# Patient Record
Sex: Female | Born: 2000 | Race: Black or African American | Hispanic: No | Marital: Single | Smoking: Never smoker
Health system: Southern US, Community
[De-identification: ages and names within clinical notes are randomized; demographics above are authoritative.]

## PROBLEM LIST (undated history)

## (undated) DIAGNOSIS — Z789 Other specified health status: Secondary | ICD-10-CM

## (undated) DIAGNOSIS — J353 Hypertrophy of tonsils with hypertrophy of adenoids: Secondary | ICD-10-CM

## (undated) HISTORY — PX: NO PAST SURGERIES: SHX2092

---
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
## 2020-11-29 ENCOUNTER — Emergency Department (HOSPITAL_BASED_OUTPATIENT_CLINIC_OR_DEPARTMENT_OTHER): Payer: BC Managed Care – PPO

## 2020-11-29 ENCOUNTER — Emergency Department (HOSPITAL_BASED_OUTPATIENT_CLINIC_OR_DEPARTMENT_OTHER)
Admission: EM | Admit: 2020-11-29 | Discharge: 2020-11-29 | Disposition: A | Discharge status: Home / Self Care | Payer: BC Managed Care – PPO | Attending: Emergency Medicine | Admitting: Emergency Medicine

## 2020-11-29 ENCOUNTER — Encounter (HOSPITAL_BASED_OUTPATIENT_CLINIC_OR_DEPARTMENT_OTHER): Payer: Self-pay

## 2020-11-29 ENCOUNTER — Other Ambulatory Visit: Payer: Self-pay

## 2020-11-29 DIAGNOSIS — R350 Frequency of micturition: Secondary | ICD-10-CM | POA: Diagnosis not present

## 2020-11-29 DIAGNOSIS — R102 Pelvic and perineal pain: Secondary | ICD-10-CM | POA: Diagnosis not present

## 2020-11-29 DIAGNOSIS — R1031 Right lower quadrant pain: Secondary | ICD-10-CM | POA: Diagnosis present

## 2020-11-29 LAB — COMPREHENSIVE METABOLIC PANEL
ALT: <5 U/L (ref 0–44)
AST: 10 U/L — ABNORMAL LOW (ref 15–41)
Albumin: 4.4 g/dL (ref 3.5–5.0)
Alkaline Phosphatase: 45 U/L (ref 38–126)
Anion gap: 9 (ref 5–15)
BUN: 8 mg/dL (ref 6–20)
CO2: 26 mmol/L (ref 22–32)
Calcium: 9.9 mg/dL (ref 8.9–10.3)
Chloride: 102 mmol/L (ref 98–111)
Creatinine, Ser: 0.83 mg/dL (ref 0.44–1.00)
GFR, Estimated: >60 mL/min (ref >60)
Glucose, Bld: 90 mg/dL (ref 70–99)
Potassium: 3.6 mmol/L (ref 3.5–5.1)
Sodium: 137 mmol/L (ref 135–145)
Total Bilirubin: 0.7 mg/dL (ref 0.3–1.2)
Total Protein: 8 g/dL (ref 6.5–8.1)

## 2020-11-29 LAB — URINALYSIS, ROUTINE W REFLEX MICROSCOPIC
Bilirubin Urine: NEGATIVE
Glucose, UA: NEGATIVE mg/dL
Ketones, ur: 15 mg/dL — AB
Nitrite: NEGATIVE
Protein, ur: 30 mg/dL — AB
Specific Gravity, Urine: 1.032 — ABNORMAL HIGH (ref 1.005–1.030)
pH: 6 (ref 5.0–8.0)

## 2020-11-29 LAB — LIPASE, BLOOD: Lipase: 11 U/L (ref 11–51)

## 2020-11-29 LAB — PREGNANCY, URINE: Preg Test, Ur: NEGATIVE

## 2020-11-29 LAB — CBC
HCT: 43 % (ref 36.0–46.0)
Hemoglobin: 13.5 g/dL (ref 12.0–15.0)
MCH: 26.7 pg (ref 26.0–34.0)
MCHC: 31.4 g/dL (ref 30.0–36.0)
MCV: 85.1 fL (ref 80.0–100.0)
Platelets: 257 10*3/uL (ref 150–400)
RBC: 5.05 MIL/uL (ref 3.87–5.11)
RDW: 13.2 % (ref 11.5–15.5)
WBC: 12.2 10*3/uL — ABNORMAL HIGH (ref 4.0–10.5)
nRBC: 0 % (ref 0.0–0.2)

## 2020-11-29 LAB — WET PREP, GENITAL
Sperm: NONE SEEN
Trich, Wet Prep: NONE SEEN
WBC, Wet Prep HPF POC: >=10 — AB (ref <10)
Yeast Wet Prep HPF POC: NONE SEEN

## 2020-11-29 LAB — HIV ANTIBODY (ROUTINE TESTING W REFLEX): HIV Screen 4th Generation wRfx: NONREACTIVE

## 2020-11-29 MED ORDER — METRONIDAZOLE 500 MG PO TABS: 500.0000 mg | ORAL_TABLET | Freq: Two times a day (BID) | ORAL | 0 refills | Status: AC

## 2020-11-29 MED ORDER — IOHEXOL 300 MG/ML  SOLN
80.0000 mL | Freq: Once | INTRAMUSCULAR | Status: AC | PRN
  Administered 2020-11-29: 100 mL via INTRAVENOUS

## 2020-11-29 MED ORDER — DOXYCYCLINE HYCLATE 100 MG PO CAPS: 100.0000 mg | ORAL_CAPSULE | Freq: Two times a day (BID) | ORAL | 0 refills | Status: AC

## 2020-11-29 NOTE — Discharge Instructions (Addendum)
The pelvic ultrasound was normal and the CT scan of your abdomen did not show appendicitis or other acute findings.  You were given prescriptions for antibiotics to treat suspect pelvic inflammatory disease. Please take the antibiotic prescriptions fully.   Do not drink alcohol while in flagyl is it will make you sick with nausea and vomiting   You have been tested for HIV, syphilis, chlamydia and gonorrhea.  These results will be available in approximately 3 days and you will be contacted by the hospital if the results are positive. Avoid sexual contact until you are aware of the results, and please inform all sexual partners if you test positive for any of these diseases.  Please follow up with the OB-GYN provided on your discharge paperwork in the next 5-7 days.   Please return to the ER sooner if you have any new or worsening symptoms, or if you have any of the following symptoms:  Abdominal pain that does not go away.  You have a fever.  You keep throwing up (vomiting).  The pain is felt only in portions of the abdomen. Pain in the right side could possibly be appendicitis. In an adult, pain in the left lower portion of the abdomen could be colitis or diverticulitis.  You pass bloody or black tarry stools.  There is bright red blood in the stool.  The constipation stays for more than 4 days.  There is belly (abdominal) or rectal pain.  You do not seem to be getting better.  You have any questions or concerns.

## 2020-11-29 NOTE — ED Notes (Signed)
Patient transported to ultrasound.

## 2020-11-29 NOTE — ED Provider Notes (Addendum)
[facility] EMERGENCY DEPT Provider Note   CSN: [id number] Arrival date & time: 11/29/20  1052     History Chief Complaint  Patient presents with   Abdominal Pain    Helen Dalton is a 20 y.o. female.  HPI   20 y/o female who presents to the ED today for eval of abd pain. Reports pain is located to the lower abdomen. Pain is constant in nature and feels sharp. Rates pain 5-6/10. Denies exacerbating or alleviating factors. Denies NVD. She has had constipation. Last BM was 3 days ago. Denies dysuria, hematuria. She reports that she has had some light pink/brownish/yellow discharge. She further states that her last menstrual cycle was abnormal. Reports some urinary frequency and subjective fevers for the last week.   History reviewed. No pertinent past medical history.  There are no problems to display for this patient.   History reviewed. No pertinent surgical history.   OB History   No obstetric history on file.     No family history on file.  Social History   Tobacco Use   Smoking status: Never   Smokeless tobacco: Never  Vaping Use   Vaping Use: Every day  Substance Use Topics   Alcohol use: Yes    Comment: occ   Drug use: Never    Home Medications Prior to Admission medications   Medication Sig Start Date End Date Taking? Authorizing Provider  doxycycline (VIBRAMYCIN) 100 MG capsule Take 1 capsule (100 mg total) by mouth 2 (two) times daily for 14 days. 11/29/20 12/13/20 Yes Melvin Marmo S, PA-C  metroNIDAZOLE (FLAGYL) 500 MG tablet Take 1 tablet (500 mg total) by mouth 2 (two) times daily for 14 days. 11/29/20 12/13/20 Yes Maryam Feely S, PA-C  cyclobenzaprine (FLEXERIL) 5 MG tablet 1 tablet every 8 hours as he did for muscle spasms 11/05/17   Paulette Blanch, MD  HYDROcodone-acetaminophen (NORCO) 5-325 MG tablet Take 1 tablet by mouth every 6 (six) hours as needed for moderate pain. 11/05/17   Paulette Blanch, MD  ibuprofen (ADVIL,MOTRIN) 600  MG tablet Take 1 tablet (600 mg total) by mouth every 8 (eight) hours as needed. 11/05/17   Paulette Blanch, MD    Allergies    Patient has no known allergies.  Review of Systems   Review of Systems  Constitutional:  Positive for fever.  HENT:  Negative for sore throat.   Eyes:  Negative for visual disturbance.  Respiratory:  Negative for shortness of breath.   Cardiovascular:  Negative for chest pain.  Gastrointestinal:  Positive for constipation. Negative for abdominal pain, diarrhea, nausea and vomiting.  Genitourinary:  Positive for pelvic pain and vaginal discharge. Negative for decreased urine volume, dysuria, flank pain, frequency, hematuria and urgency.  Musculoskeletal:  Negative for back pain.  Skin:  Negative for rash.  Neurological:  Negative for seizures and syncope.  All other systems reviewed and are negative.  Physical Exam Updated Vital Signs BP (!) 101/51 (BP Location: Left Arm)   Pulse 85   Temp 98 F (36.7 C) (Oral)   Resp 16   Ht 5\' 6"  (1.676 m)   Wt 77.1 kg   LMP 11/10/2020 (Approximate)   SpO2 100%   BMI 27.44 kg/m   Physical Exam Vitals and nursing note reviewed.  Constitutional:      General: She is not in acute distress.    Appearance: She is well-developed.  HENT:     Head: Normocephalic and atraumatic.  Eyes:  Conjunctiva/sclera: Conjunctivae normal.  Cardiovascular:     Rate and Rhythm: Normal rate and regular rhythm.     Heart sounds: No murmur heard. Pulmonary:     Effort: Pulmonary effort is normal. No respiratory distress.     Breath sounds: Normal breath sounds.  Abdominal:     General: Bowel sounds are normal.     Palpations: Abdomen is soft.     Tenderness: There is abdominal tenderness in the right lower quadrant. There is no guarding or rebound.  Genitourinary:    Comments: Exam performed by Karrie Meres,  exam chaperoned Date: 11/29/2020 Pelvic exam: normal external genitalia without evidence of trauma. VULVA: normal  appearing vulva with no masses, tenderness or lesion. VAGINA: normal appearing vagina with normal color and white discharge, no lesions. CERVIX: normal appearing cervix without lesions, cervical motion tenderness present and mild, cervical os closed with out purulent discharge; vaginal discharge - white and copious, Wet prep and DNA probe for chlamydia and GC obtained.   ADNEXA: normal adnexa in size, and no masses, mild right adnexal ttp UTERUS: uterus is normal size, shape, consistency and nontender.   Musculoskeletal:        General: No swelling.     Cervical back: Neck supple.  Skin:    General: Skin is warm and dry.     Capillary Refill: Capillary refill takes less than 2 seconds.  Neurological:     Mental Status: She is alert.  Psychiatric:        Mood and Affect: Mood normal.    ED Results / Procedures / Treatments   Labs (all labs ordered are listed, but only abnormal results are displayed) Labs Reviewed  WET PREP, GENITAL - Abnormal; Notable for the following components:      Result Value   Clue Cells Wet Prep HPF POC PRESENT (*)    WBC, Wet Prep HPF POC >=10 (*)    All other components within normal limits  COMPREHENSIVE METABOLIC PANEL - Abnormal; Notable for the following components:   AST 10 (*)    All other components within normal limits  CBC - Abnormal; Notable for the following components:   WBC 12.2 (*)    All other components within normal limits  URINALYSIS, ROUTINE W REFLEX MICROSCOPIC - Abnormal; Notable for the following components:   Specific Gravity, Urine 1.032 (*)    Hgb urine dipstick SMALL (*)    Ketones, ur 15 (*)    Protein, ur 30 (*)    Leukocytes,Ua MODERATE (*)    Bacteria, UA RARE (*)    All other components within normal limits  LIPASE, BLOOD  PREGNANCY, URINE  HIV ANTIBODY (ROUTINE TESTING W REFLEX)  RPR  GC/CHLAMYDIA PROBE AMP ([place]) NOT AT [facility]    EKG None  Radiology CT ABDOMEN PELVIS W CONTRAST  Result Date:  11/29/2020 CLINICAL DATA:  Right lower quadrant abdominal pain following menstrual period two weeks ago EXAM: CT ABDOMEN AND PELVIS WITH CONTRAST TECHNIQUE: Multidetector CT imaging of the abdomen and pelvis was performed using the standard protocol following bolus administration of intravenous contrast. CONTRAST:  OMNIPAQUE IOHEXOL 300 MG/ML  SOLN COMPARISON:  None. FINDINGS: Lower chest: No acute abnormality. Hepatobiliary: No solid liver abnormality is seen. No gallstones, gallbladder wall thickening, or biliary dilatation. Pancreas: Unremarkable. No pancreatic ductal dilatation or surrounding inflammatory changes. Spleen: Normal in size without significant abnormality. Adrenals/Urinary Tract: Adrenal glands are unremarkable. Kidneys are normal, without renal calculi, solid lesion, or hydronephrosis. Bladder is unremarkable.  Stomach/Bowel: Stomach is within normal limits. Appendix is not clearly visualized. No evidence of bowel wall thickening, distention, or inflammatory changes. Vascular/Lymphatic: No significant vascular findings are present. No enlarged abdominal or pelvic lymph nodes. Reproductive: No mass or other significant abnormality. Bilateral functional ovarian follicles. Other: No abdominal wall hernia or abnormality. No abdominopelvic ascites. Musculoskeletal: No acute or significant osseous findings. IMPRESSION: 1. No acute CT findings of the abdomen or pelvis to explain right lower quadrant pain. 2. Appendix is not clearly visualized, however there are no secondary inflammatory findings in the right lower quadrant to suggest appendicitis. Electronically Signed   By: Delanna Ahmadi M.D.   On: 11/29/2020 17:56   US PELVIC COMPLETE WITH TRANSVAGINAL  Result Date: 11/29/2020 CLINICAL DATA:  Pelvic pain for 1 week with spotting EXAM: TRANSABDOMINAL AND TRANSVAGINAL ULTRASOUND OF PELVIS TECHNIQUE: Both transabdominal and transvaginal ultrasound examinations of the pelvis were performed.  Transabdominal technique was performed for global imaging of the pelvis including uterus, ovaries, adnexal regions, and pelvic cul-de-sac. It was necessary to proceed with endovaginal exam following the transabdominal exam to visualize the endometrium. COMPARISON:  None FINDINGS: Uterus Measurements: 7.2 x 3.6 x 4.1 cm = volume: 56 mL. Anteverted. No fibroids or other mass visualized. Endometrium Thickness: 10 mm.  No focal abnormality visualized. Right ovary Measurements: 3.3 x 2.5 x 2.7 cm = volume: 12 mL. Multiple small follicles. Normal appearance/no adnexal mass. Left ovary Measurements: 3.7 x 2.6 x 2.6 cm = volume: 13 mL. Multiple small follicles. Normal appearance/no adnexal mass. Other findings No abnormal free fluid. IMPRESSION: Unremarkable pelvic ultrasound. No findings to explain patient's pain. Electronically Signed   By: Davina Poke D.O.   On: 11/29/2020 16:07    Procedures Procedures   Medications Ordered in ED Medications  iohexol (OMNIPAQUE) 300 MG/ML solution 80 mL (100 mLs Intravenous Contrast Given 11/29/20 1628)    ED Course  I have reviewed the triage vital signs and the nursing notes.  Pertinent labs & imaging results that were available during my care of the patient were reviewed by me and considered in my medical decision making (see chart for details).    MDM Rules/Calculators/A&P                          20 y/o f here with pelvic pain   Reviewed/interpreted labs CBC - mild leukocytosis, no anemia CMP - wnl Lipase - wnl UA - appears contaminated, doubt UTI Wet prep consistent with BV GC/chlam, HIV, RPR pending   Reviewed/interpreted imaging Pelvic US - neg for TOOA or other emergent abnormality CT abd/pelvis w/o emergent findings  Pt likely with PID. Will tx with ceftriaxone, flagyl and doxy. Advised obgyn f/u and strict return precautions. She voices understanding and is in agreement with plan. All questions answered, pt stable or discharge.   Final  Clinical Impression(s) / ED Diagnoses Final diagnoses:  Pelvic pain    Rx / DC Orders ED Discharge Orders          Ordered    doxycycline (VIBRAMYCIN) 100 MG capsule  2 times daily        11/29/20 1806    metroNIDAZOLE (FLAGYL) 500 MG tablet  2 times daily        11/29/20 1807             Niko Jakel S, PA-C 11/29/20 1807    Rodney Booze, PA-C 11/29/20 2141    Gareth Morgan, MD 11/30/20 0004

## 2020-11-29 NOTE — ED Triage Notes (Signed)
Pt states she has been having lower abdominal pain since her period stopped about 2 weeks ago. Pt states her period was WNL, maybe worsened cramping. Denies any NVD. Some vaginal bleeding after her period ended. Denies urinary symptoms. Denies concern for STD or STI.

## 2020-11-30 LAB — GC/CHLAMYDIA PROBE AMP (~~LOC~~) NOT AT ARMC
Chlamydia: NEGATIVE
Comment: NEGATIVE
Comment: NORMAL
Neisseria Gonorrhea: POSITIVE — AB

## 2020-11-30 LAB — RPR: RPR Ser Ql: NONREACTIVE

## 2020-12-02 ENCOUNTER — Other Ambulatory Visit: Payer: Self-pay

## 2020-12-02 ENCOUNTER — Emergency Department (HOSPITAL_BASED_OUTPATIENT_CLINIC_OR_DEPARTMENT_OTHER)
Admission: EM | Admit: 2020-12-02 | Discharge: 2020-12-02 | Disposition: A | Discharge status: Home / Self Care | Payer: BC Managed Care – PPO | Attending: Emergency Medicine | Admitting: Emergency Medicine

## 2020-12-02 ENCOUNTER — Encounter (HOSPITAL_BASED_OUTPATIENT_CLINIC_OR_DEPARTMENT_OTHER): Payer: Self-pay

## 2020-12-02 DIAGNOSIS — Z202 Contact with and (suspected) exposure to infections with a predominantly sexual mode of transmission: Secondary | ICD-10-CM | POA: Diagnosis present

## 2020-12-02 DIAGNOSIS — A549 Gonococcal infection, unspecified: Secondary | ICD-10-CM | POA: Diagnosis not present

## 2020-12-02 MED ORDER — LIDOCAINE HCL (PF) 1 % IJ SOLN
INTRAMUSCULAR | Status: AC
  Administered 2020-12-02: 1 mL
  Filled 2020-12-02: qty 5

## 2020-12-02 MED ORDER — CEFTRIAXONE SODIUM 500 MG IJ SOLR
500.0000 mg | Freq: Once | INTRAMUSCULAR | Status: AC
  Administered 2020-12-02: 500 mg via INTRAMUSCULAR
  Filled 2020-12-02: qty 500

## 2020-12-02 NOTE — ED Provider Notes (Signed)
[facility] EMERGENCY DEPT Provider Note   CSN: [id number] Arrival date & time: 12/02/20  0849     History Chief Complaint  Patient presents with   Exposure to STD    Helen Dalton is a 20 y.o. female.  HPI Patient had STD testing in the emergency department 3 days ago.  Gonorrhea is returned positive.  Patient is here for treatment.  She has been taking doxycycline and Flagyl as prescribed after being treated for suspected PID.  She reports she is feeling better.  She denies any significant ongoing pain.  No fever no nausea no vomiting.  No joint swelling, no skin rashes, no sore throat.    History reviewed. No pertinent past medical history.  There are no problems to display for this patient.   History reviewed. No pertinent surgical history.   OB History   No obstetric history on file.     No family history on file.  Social History   Tobacco Use   Smoking status: Never   Smokeless tobacco: Never  Vaping Use   Vaping Use: Every day  Substance Use Topics   Alcohol use: Yes    Comment: occ   Drug use: Never    Home Medications Prior to Admission medications   Medication Sig Start Date End Date Taking? Authorizing Provider  doxycycline (VIBRAMYCIN) 100 MG capsule Take 1 capsule (100 mg total) by mouth 2 (two) times daily for 14 days. 11/29/20 12/13/20 Yes Couture, Cortni S, PA-C  metroNIDAZOLE (FLAGYL) 500 MG tablet Take 1 tablet (500 mg total) by mouth 2 (two) times daily for 14 days. 11/29/20 12/13/20 Yes Couture, Cortni S, PA-C  cyclobenzaprine (FLEXERIL) 5 MG tablet 1 tablet every 8 hours as he did for muscle spasms Patient not taking: Reported on 12/02/2020 11/05/17   Irean Hong, MD  HYDROcodone-acetaminophen (NORCO) 5-325 MG tablet Take 1 tablet by mouth every 6 (six) hours as needed for moderate pain. Patient not taking: Reported on 12/02/2020 11/05/17   Irean Hong, MD  ibuprofen (ADVIL,MOTRIN) 600 MG tablet Take 1 tablet (600 mg  total) by mouth every 8 (eight) hours as needed. Patient not taking: Reported on 12/02/2020 11/05/17   Irean Hong, MD    Allergies    Patient has no known allergies.  Review of Systems   Review of Systems 10 system reviewed negative except per HPI Physical Exam Updated Vital Signs BP 104/72 (BP Location: Right Arm)   Pulse 79   Temp 98.2 F (36.8 C) (Oral)   Resp 14   Ht 5\' 6"  (1.676 m)   Wt 76.2 kg   LMP 11/10/2020 (Approximate)   SpO2 100%   BMI 27.12 kg/m   Physical Exam Constitutional:      Appearance: Normal appearance.  HENT:     Mouth/Throat:     Mouth: Mucous membranes are moist.     Pharynx: Oropharynx is clear.  Eyes:     Extraocular Movements: Extraocular movements intact.     Conjunctiva/sclera: Conjunctivae normal.  Cardiovascular:     Rate and Rhythm: Normal rate and regular rhythm.  Pulmonary:     Effort: Pulmonary effort is normal.     Breath sounds: Normal breath sounds.  Abdominal:     General: There is no distension.     Palpations: Abdomen is soft.     Tenderness: There is no abdominal tenderness. There is no guarding.  Musculoskeletal:        General: No swelling or tenderness. Normal range of  motion.     Right lower leg: No edema.  Skin:    General: Skin is warm and dry.  Neurological:     General: No focal deficit present.     Mental Status: She is alert and oriented to person, place, and time.     Coordination: Coordination normal.  Psychiatric:        Mood and Affect: Mood normal.    ED Results / Procedures / Treatments   Labs (all labs ordered are listed, but only abnormal results are displayed) Labs Reviewed - No data to display  EKG None  Radiology No results found.  Procedures Procedures   Medications Ordered in ED Medications  cefTRIAXone (ROCEPHIN) injection 500 mg (has no administration in time range)    ED Course  I have reviewed the triage vital signs and the nursing notes.  Pertinent labs & imaging  results that were available during my care of the patient were reviewed by me and considered in my medical decision making (see chart for details).    MDM Rules/Calculators/A&P                           Patient alert and nontoxic.  Clinically well.  Review of systems negative for any systemic signs of gonorrhea.  Patient will be treated with Rocephin IM.  She is already taking doxycycline and Flagyl prescribed 3 days earlier.  Also to continue these medications.  No pain control needed at this time. Final Clinical Impression(s) / ED Diagnoses Final diagnoses:  Gonorrhea    Rx / DC Orders ED Discharge Orders     None        Arby Barrette, MD 12/04/20 [phone]

## 2020-12-02 NOTE — ED Triage Notes (Signed)
Pt states she was seen 11/20 for STD screening.  States she received a phone call yesterday from ED with instructions to return to ED due to one testing positive for one of STD screenings.  Pt currently taking Doxycycline 100 mg 2x/day and Metronidazole 500 mg 2x/day.

## 2020-12-02 NOTE — Discharge Instructions (Signed)
1.  Follow-up with your gynecologist for recheck in 1 to 2 weeks. 2.  Gonorrhea is treated with a one-time injection of antibiotic.  Continue your doxycycline and Flagyl that were [place] 3 days earlier.  Finish these prescriptions.  Take ibuprofen if needed for pain. 3 return to the emergency department if you get suddenly increasing pain, fever, vomiting or other concerning symptoms.

## 2021-01-12 ENCOUNTER — Ambulatory Visit
Admission: EM | Admit: 2021-01-12 | Discharge: 2021-01-12 | Disposition: A | Discharge status: Home / Self Care | Payer: BC Managed Care – PPO | Attending: Physician Assistant | Admitting: Physician Assistant

## 2021-01-12 ENCOUNTER — Other Ambulatory Visit: Payer: Self-pay

## 2021-01-12 ENCOUNTER — Encounter: Payer: Self-pay | Admitting: Emergency Medicine

## 2021-01-12 DIAGNOSIS — N898 Other specified noninflammatory disorders of vagina: Secondary | ICD-10-CM | POA: Diagnosis present

## 2021-01-12 MED ORDER — FLUCONAZOLE 150 MG PO TABS: 150.0000 mg | ORAL_TABLET | Freq: Every day | ORAL | 0 refills | Status: DC

## 2021-01-12 NOTE — ED Provider Notes (Signed)
[facility]    CSN: [id number] Arrival date & time: 01/12/21  [id number]      History   Chief Complaint Chief Complaint  Patient presents with   Vaginitis    HPI Helen Dalton is a 21 y.o. female.   Patient here today for evaluation of vaginal discharge. She reports that she thinks she may have a yeast infection because discharge is thick and white. She has had some itching vaginally as well. She denies vaginal odor. She would like STD screening but denies any known exposure. She has not had any other symptoms.  The history is provided by the patient.   History reviewed. No pertinent past medical history.  There are no problems to display for this patient.   History reviewed. No pertinent surgical history.  OB History   No obstetric history on file.      Home Medications    Prior to Admission medications   Medication Sig Start Date End Date Taking? Authorizing Provider  fluconazole (DIFLUCAN) 150 MG tablet Take 1 tablet (150 mg total) by mouth daily. 01/12/21  Yes Francene Finders, PA-C  cyclobenzaprine (FLEXERIL) 5 MG tablet 1 tablet every 8 hours as he did for muscle spasms Patient not taking: Reported on 12/02/2020 11/05/17   Paulette Blanch, MD  HYDROcodone-acetaminophen (NORCO) 5-325 MG tablet Take 1 tablet by mouth every 6 (six) hours as needed for moderate pain. Patient not taking: Reported on 12/02/2020 11/05/17   Paulette Blanch, MD  ibuprofen (ADVIL,MOTRIN) 600 MG tablet Take 1 tablet (600 mg total) by mouth every 8 (eight) hours as needed. Patient not taking: Reported on 12/02/2020 11/05/17   Paulette Blanch, MD    Family History History reviewed. No pertinent family history.  Social History Social History   Tobacco Use   Smoking status: Never   Smokeless tobacco: Never  Vaping Use   Vaping Use: Every day  Substance Use Topics   Alcohol use: Yes    Comment: occ   Drug use: Never     Allergies   Patient has no known allergies.   Review of  Systems Review of Systems  Constitutional:  Negative for chills and fever.  Eyes:  Negative for discharge and redness.  Respiratory:  Negative for shortness of breath.   Gastrointestinal:  Negative for abdominal pain, nausea and vomiting.  Genitourinary:  Positive for vaginal discharge. Negative for dysuria.  Musculoskeletal:  Negative for back pain.    Physical Exam Triage Vital Signs ED Triage Vitals  Enc Vitals Group     BP      Pulse      Resp      Temp      Temp src      SpO2      Weight      Height      Head Circumference      Peak Flow      Pain Score      Pain Loc      Pain Edu?      Excl. in [place]?    No data found.  Updated Vital Signs BP 112/74 (BP Location: Left Arm)    Pulse 80    Temp 99 F (37.2 C) (Oral)    Resp 18    Ht 5\' 6"  (1.676 m)    Wt 167 lb (75.8 kg)    LMP 12/10/2020    SpO2 98%    BMI 26.95 kg/m     Physical Exam Vitals  and nursing note reviewed.  Constitutional:      General: She is not in acute distress.    Appearance: Normal appearance. She is not ill-appearing.  HENT:     Head: Normocephalic and atraumatic.  Eyes:     Conjunctiva/sclera: Conjunctivae normal.  Cardiovascular:     Rate and Rhythm: Normal rate.  Pulmonary:     Effort: Pulmonary effort is normal.  Neurological:     Mental Status: She is alert.  Psychiatric:        Mood and Affect: Mood normal.        Behavior: Behavior normal.        Thought Content: Thought content normal.     UC Treatments / Results  Labs (all labs ordered are listed, but only abnormal results are displayed) Labs Reviewed  CERVICOVAGINAL ANCILLARY ONLY    EKG   Radiology No results found.  Procedures Procedures (including critical care time)  Medications Ordered in UC Medications - No data to display  Initial Impression / Assessment and Plan / UC Course  I have reviewed the triage vital signs and the nursing notes.  Pertinent labs & imaging results that were available during my  care of the patient were reviewed by me and considered in my medical decision making (see chart for details).   Diflucan prescribed. STD screening ordered. Recommend follow up with any further concerns.  Final Clinical Impressions(s) / UC Diagnoses   Final diagnoses:  Vaginal discharge   Discharge Instructions   None    ED Prescriptions     Medication Sig Dispense Auth. Provider   fluconazole (DIFLUCAN) 150 MG tablet Take 1 tablet (150 mg total) by mouth daily. 1 tablet Francene Finders, PA-C      PDMP not reviewed this encounter.   Francene Finders, PA-C 01/12/21 [phone]

## 2021-01-12 NOTE — ED Triage Notes (Signed)
Patient c/o possible yeast infection x 1 week.  Patient is having thick white vaginal discharge and itchy, no odor.  Requesting STI check as well.

## 2021-01-13 LAB — CERVICOVAGINAL ANCILLARY ONLY
Bacterial Vaginitis (gardnerella): NEGATIVE
Candida Glabrata: NEGATIVE
Candida Vaginitis: POSITIVE — AB
Chlamydia: NEGATIVE
Comment: NEGATIVE
Comment: NEGATIVE
Comment: NEGATIVE
Comment: NEGATIVE
Comment: NEGATIVE
Comment: NORMAL
Neisseria Gonorrhea: NEGATIVE
Trichomonas: NEGATIVE

## 2021-06-04 ENCOUNTER — Ambulatory Visit
Admission: EM | Admit: 2021-06-04 | Discharge: 2021-06-04 | Disposition: A | Discharge status: Home / Self Care | Payer: BC Managed Care – PPO | Attending: Physician Assistant | Admitting: Physician Assistant

## 2021-06-04 ENCOUNTER — Encounter: Payer: Self-pay | Admitting: Emergency Medicine

## 2021-06-04 DIAGNOSIS — N898 Other specified noninflammatory disorders of vagina: Secondary | ICD-10-CM | POA: Insufficient documentation

## 2021-06-04 DIAGNOSIS — Z113 Encounter for screening for infections with a predominantly sexual mode of transmission: Secondary | ICD-10-CM | POA: Diagnosis not present

## 2021-06-04 LAB — POCT URINE PREGNANCY: Preg Test, Ur: NEGATIVE

## 2021-06-04 MED ORDER — DOXYCYCLINE HYCLATE 100 MG PO CAPS: 100.0000 mg | ORAL_CAPSULE | Freq: Two times a day (BID) | ORAL | 0 refills | Status: DC

## 2021-06-04 MED ORDER — CEFTRIAXONE SODIUM 250 MG IJ SOLR
500.0000 mg | Freq: Once | INTRAMUSCULAR | Status: AC
  Administered 2021-06-04: 500 mg via INTRAMUSCULAR

## 2021-06-04 NOTE — ED Triage Notes (Signed)
Patient c/o vaginal discharge x 1 day.   Patient denies odor, ABD pain, or vaginal itching.   Patient denies any known STI exposures.   Patient endorses " brown" discharge.   Patient request STI testing.

## 2021-06-04 NOTE — Discharge Instructions (Signed)
Return if any problems.

## 2021-06-06 NOTE — ED Provider Notes (Signed)
[facility]    CSN: [id number] Arrival date & time: 06/04/21  0840      History   Chief Complaint Chief Complaint  Patient presents with   Vaginal Discharge    HPI Helen Dalton is a 21 y.o. female.   Patient complains of a brown vaginal discharge she request testing for sexually transmitted disease patient is asking if she can receive treatment today  The history is provided by the patient. No language interpreter was used.  Vaginal Discharge Quality:  Manson Passey Timing:  Constant Chronicity:  New Relieved by:  Nothing Worsened by:  Nothing Ineffective treatments:  None tried Risk factors: STI    History reviewed. No pertinent past medical history.  There are no problems to display for this patient.   History reviewed. No pertinent surgical history.  OB History   No obstetric history on file.      Home Medications    Prior to Admission medications   Medication Sig Start Date End Date Taking? Authorizing Provider  clindamycin (CLEOCIN T) 1 % lotion Apply topically every morning. 04/07/21  Yes [provider]  doxycycline (VIBRAMYCIN) 100 MG capsule Take 1 capsule (100 mg total) by mouth 2 (two) times daily. 06/04/21  Yes Cheron Schaumann K, PA-C  tretinoin (RETIN-A) 0.025 % cream Apply topically at bedtime. 04/07/21  Yes [provider]  cyclobenzaprine (FLEXERIL) 5 MG tablet 1 tablet every 8 hours as he did for muscle spasms Patient not taking: Reported on 12/02/2020 11/05/17   Irean Hong, MD  fluconazole (DIFLUCAN) 150 MG tablet Take 1 tablet (150 mg total) by mouth daily. 01/12/21   Tomi Bamberger, PA-C  HYDROcodone-acetaminophen (NORCO) 5-325 MG tablet Take 1 tablet by mouth every 6 (six) hours as needed for moderate pain. Patient not taking: Reported on 12/02/2020 11/05/17   Irean Hong, MD  ibuprofen (ADVIL,MOTRIN) 600 MG tablet Take 1 tablet (600 mg total) by mouth every 8 (eight) hours as needed. Patient not taking: Reported  on 12/02/2020 11/05/17   Irean Hong, MD    Family History History reviewed. No pertinent family history.  Social History Social History   Tobacco Use   Smoking status: Never   Smokeless tobacco: Never  Vaping Use   Vaping Use: Every day  Substance Use Topics   Alcohol use: Yes    Comment: occ   Drug use: Never     Allergies   Patient has no known allergies.   Review of Systems Review of Systems  Genitourinary:  Positive for vaginal discharge.  All other systems reviewed and are negative.   Physical Exam Triage Vital Signs ED Triage Vitals  Enc Vitals Group     BP 06/04/21 0956 108/73     Pulse Rate 06/04/21 0956 85     Resp 06/04/21 0956 16     Temp 06/04/21 0956 97.8 F (36.6 C)     Temp Source 06/04/21 0956 Oral     SpO2 06/04/21 0956 98 %     Weight --      Height --      Head Circumference --      Peak Flow --      Pain Score 06/04/21 0959 0     Pain Loc --      Pain Edu? --      Excl. in [place]? --    No data found.  Updated Vital Signs BP 108/73 (BP Location: Right Arm)   Pulse 85   Temp 97.8  F (36.6 C) (Oral)   Resp 16   LMP 05/10/2021 (Approximate)   SpO2 98%   Visual Acuity Right Eye Distance:   Left Eye Distance:   Bilateral Distance:    Right Eye Near:   Left Eye Near:    Bilateral Near:     Physical Exam Vitals and nursing note reviewed.  Constitutional:      Appearance: She is well-developed.  HENT:     Head: Normocephalic.     Mouth/Throat:     Mouth: Mucous membranes are moist.  Cardiovascular:     Rate and Rhythm: Normal rate.  Pulmonary:     Effort: Pulmonary effort is normal.  Abdominal:     General: There is no distension.  Musculoskeletal:        General: Normal range of motion.     Cervical back: Normal range of motion.  Skin:    General: Skin is warm.  Neurological:     Mental Status: She is alert and oriented to person, place, and time.  Psychiatric:        Mood and Affect: Mood normal.     UC  Treatments / Results  Labs (all labs ordered are listed, but only abnormal results are displayed) Labs Reviewed  POCT URINE PREGNANCY  CERVICOVAGINAL ANCILLARY ONLY    EKG   Radiology No results found.  Procedures Procedures (including critical care time)  Medications Ordered in UC Medications  cefTRIAXone (ROCEPHIN) injection 500 mg (500 mg Intramuscular Given 06/04/21 1045)    Initial Impression / Assessment and Plan / UC Course  I have reviewed the triage vital signs and the nursing notes.  Pertinent labs & imaging results that were available during my care of the patient were reviewed by me and considered in my medical decision making (see chart for details).     MDM: Patient given Rocephin injection and prescription for doxycycline STD testing pending Final Clinical Impressions(s) / UC Diagnoses   Final diagnoses:  Vaginal discharge     Discharge Instructions      Return if any problems.    ED Prescriptions     Medication Sig Dispense Auth. Provider   doxycycline (VIBRAMYCIN) 100 MG capsule Take 1 capsule (100 mg total) by mouth 2 (two) times daily. 20 capsule Elson Areas, [place]      PDMP not reviewed this encounter. An After Visit Summary was printed and given to the patient.    Elson Areas, [place] 06/06/21 [phone]

## 2021-06-09 LAB — CERVICOVAGINAL ANCILLARY ONLY
Bacterial Vaginitis (gardnerella): NEGATIVE
Candida Glabrata: NEGATIVE
Candida Vaginitis: POSITIVE — AB
Chlamydia: POSITIVE — AB
Comment: NEGATIVE
Comment: NEGATIVE
Comment: NEGATIVE
Comment: NEGATIVE
Comment: NEGATIVE
Comment: NORMAL
Neisseria Gonorrhea: NEGATIVE
Trichomonas: NEGATIVE

## 2021-06-10 ENCOUNTER — Telehealth (HOSPITAL_COMMUNITY): Payer: Self-pay | Admitting: Emergency Medicine

## 2021-06-10 MED ORDER — FLUCONAZOLE 150 MG PO TABS: 150.0000 mg | ORAL_TABLET | Freq: Once | ORAL | 0 refills | Status: AC

## 2021-10-26 ENCOUNTER — Ambulatory Visit
Admission: EM | Admit: 2021-10-26 | Discharge: 2021-10-26 | Disposition: A | Discharge status: Home / Self Care | Payer: BC Managed Care – PPO | Attending: Physician Assistant | Admitting: Physician Assistant

## 2021-10-26 DIAGNOSIS — N760 Acute vaginitis: Secondary | ICD-10-CM

## 2021-10-26 DIAGNOSIS — Z113 Encounter for screening for infections with a predominantly sexual mode of transmission: Secondary | ICD-10-CM | POA: Diagnosis present

## 2021-10-26 MED ORDER — IBUPROFEN 600 MG PO TABS: 600.0000 mg | ORAL_TABLET | Freq: Three times a day (TID) | ORAL | 0 refills | Status: DC | PRN

## 2021-10-26 NOTE — ED Provider Notes (Signed)
[facility]    CSN: [id number] Arrival date & time: 10/26/21  0827      History   Chief Complaint Chief Complaint  Patient presents with   Vaginal Discharge    HPI Helen Dalton is a 21 y.o. female.   21 year old female presents with vaginal discharge and right lower quadrant pain.  Patient indicates that she desires to have STI testing to include HIV and RPR.  Patient indicates that her last sexual contact was about a week and a half ago and this was unprotected.  Patient indicates for the past week she has been having vaginal discharge, thick, white, with an odor.  Patient indicates she does not have any vaginal itching present.  Patient indicates she does not have any frequency, urgency, or dysuria.  She indicates that she is currently not taking any birth control, her last period was September 29 and was normal.  She also indicates she has had some intermittent right lower quadrant pain and discomfort which is localized, with some mild increased bowel gas.  She denies any fever, chills, nausea or vomiting.  She indicates she is not taking any OTC medicines presently to help relieve her discomfort.   Vaginal Discharge Associated symptoms: abdominal pain (right lower quadrant at ovary.)     History reviewed. No pertinent past medical history.  There are no problems to display for this patient.   History reviewed. No pertinent surgical history.  OB History   No obstetric history on file.      Home Medications    Prior to Admission medications   Medication Sig Start Date End Date Taking? Authorizing Provider  clindamycin (CLEOCIN T) 1 % lotion Apply topically every morning. 04/07/21   [provider]  cyclobenzaprine (FLEXERIL) 5 MG tablet 1 tablet every 8 hours as he did for muscle spasms Patient not taking: Reported on 12/02/2020 11/05/17   Paulette Blanch, MD  doxycycline (VIBRAMYCIN) 100 MG capsule Take 1 capsule (100 mg total) by mouth 2 (two)  times daily. 06/04/21   Fransico Meadow, PA-C  HYDROcodone-acetaminophen (NORCO) 5-325 MG tablet Take 1 tablet by mouth every 6 (six) hours as needed for moderate pain. Patient not taking: Reported on 12/02/2020 11/05/17   Paulette Blanch, MD  ibuprofen (ADVIL) 600 MG tablet Take 1 tablet (600 mg total) by mouth every 8 (eight) hours as needed. 10/26/21   Nyoka Lint, PA-C  tretinoin (RETIN-A) 0.025 % cream Apply topically at bedtime. 04/07/21   [provider]    Family History History reviewed. No pertinent family history.  Social History Social History   Tobacco Use   Smoking status: Never   Smokeless tobacco: Never  Vaping Use   Vaping Use: Every day  Substance Use Topics   Alcohol use: Yes    Comment: occ   Drug use: Never     Allergies   Patient has no known allergies.   Review of Systems Review of Systems  Gastrointestinal:  Positive for abdominal pain (right lower quadrant at ovary.).  Genitourinary:  Positive for vaginal discharge.     Physical Exam Triage Vital Signs ED Triage Vitals  Enc Vitals Group     BP 10/26/21 0943 111/76     Pulse Rate 10/26/21 0943 68     Resp 10/26/21 0943 18     Temp 10/26/21 0943 (!) 97.4 F (36.3 C)     Temp Source 10/26/21 0943 Oral     SpO2 10/26/21 0943 98 %  Weight --      Height --      Head Circumference --      Peak Flow --      Pain Score 10/26/21 0942 3     Pain Loc --      Pain Edu? --      Excl. in [place]? --    No data found.  Updated Vital Signs BP 111/76   Pulse 68   Temp (!) 97.4 F (36.3 C) (Oral)   Resp 18   LMP 10/01/2021   SpO2 98%   Visual Acuity Right Eye Distance:   Left Eye Distance:   Bilateral Distance:    Right Eye Near:   Left Eye Near:    Bilateral Near:     Physical Exam Constitutional:      Appearance: Normal appearance.  Abdominal:     General: Abdomen is flat. Bowel sounds are normal.     Palpations: Abdomen is soft.     Tenderness: There is abdominal  tenderness in the right lower quadrant. There is no guarding or rebound.     Comments: Abdomen: Pain is palpated at the right ovary area without guarding.  Neurological:     Mental Status: She is alert.      UC Treatments / Results  Labs (all labs ordered are listed, but only abnormal results are displayed) Labs Reviewed  HIV ANTIBODY (ROUTINE TESTING W REFLEX)  RAPID HIV SCREEN (HIV 1/2 AB+AG)  CERVICOVAGINAL ANCILLARY ONLY    EKG   Radiology No results found.  Procedures Procedures (including critical care time)  Medications Ordered in UC Medications - No data to display  Initial Impression / Assessment and Plan / UC Course  I have reviewed the triage vital signs and the nursing notes.  Pertinent labs & imaging results that were available during my care of the patient were reviewed by me and considered in my medical decision making (see chart for details).    Plan: 1.  The vaginitis will be treated with the following: A.  STI testing results to include screening for BV and yeast is pending. B.  The vaginitis will be treated depending on the results of the STI testing. 2.  Screening for STI will be treated with the following: A.  STI testing notes to include HIV and RPR is pending. 3.  Patient is advised to take ibuprofen 600 mg every 8 hours to treat the right lower quadrant ovarian pain when needed. 4.  Advise follow-up with PCP or return to urgent care as needed. Final Clinical Impressions(s) / UC Diagnoses   Final diagnoses:  Vaginitis and vulvovaginitis  Routine screening for STI (sexually transmitted infection)     Discharge Instructions      Advised take ibuprofen 600 mg every 8 hours if needed for pain. Lab test will be completed in 48-72 hours.  If you do not hear from this office within 48-72 hours that indicates the test is negative.  Log onto MyChart to view the test results when they post in 24 and 48 hours. Advised to follow-up with PCP or return  to urgent care if symptoms fail to improve.    ED Prescriptions     Medication Sig Dispense Auth. Provider   ibuprofen (ADVIL) 600 MG tablet  (Status: Discontinued) Take 1 tablet (600 mg total) by mouth every 8 (eight) hours as needed. 15 tablet Ellsworth Lennox, PA-C   ibuprofen (ADVIL) 600 MG tablet Take 1 tablet (600 mg total) by mouth every  8 (eight) hours as needed. 15 tablet Ellsworth Lennox, PA-C      PDMP not reviewed this encounter.   Ellsworth Lennox, PA-C 10/26/21 1004

## 2021-10-26 NOTE — ED Triage Notes (Signed)
Pt presents to uc with co of vaginal discharge for 2 days, pt reports pmh of bv concerned for potential bv again, slight right sided pelvic pain.

## 2021-10-26 NOTE — Discharge Instructions (Signed)
Advised take ibuprofen 600 mg every 8 hours if needed for pain. Lab test will be completed in 48-72 hours.  If you do not hear from this office within 48-72 hours that indicates the test is negative.  Log onto MyChart to view the test results when they post in 24 and 48 hours. Advised to follow-up with PCP or return to urgent care if symptoms fail to improve.

## 2021-10-27 LAB — CERVICOVAGINAL ANCILLARY ONLY
Bacterial Vaginitis (gardnerella): POSITIVE — AB
Candida Glabrata: NEGATIVE
Candida Vaginitis: POSITIVE — AB
Chlamydia: NEGATIVE
Comment: NEGATIVE
Comment: NEGATIVE
Comment: NEGATIVE
Comment: NEGATIVE
Comment: NEGATIVE
Comment: NORMAL
Neisseria Gonorrhea: NEGATIVE
Trichomonas: NEGATIVE

## 2021-10-27 LAB — HIV ANTIBODY (ROUTINE TESTING W REFLEX): HIV Screen 4th Generation wRfx: NONREACTIVE

## 2021-10-27 LAB — RPR: RPR Ser Ql: NONREACTIVE

## 2021-10-28 ENCOUNTER — Telehealth (HOSPITAL_COMMUNITY): Payer: Self-pay | Admitting: Emergency Medicine

## 2021-10-28 MED ORDER — METRONIDAZOLE 500 MG PO TABS: 500.0000 mg | ORAL_TABLET | Freq: Two times a day (BID) | ORAL | 0 refills | Status: DC

## 2021-10-28 MED ORDER — FLUCONAZOLE 150 MG PO TABS: 150.0000 mg | ORAL_TABLET | Freq: Once | ORAL | 0 refills | Status: AC

## 2022-06-27 ENCOUNTER — Encounter (HOSPITAL_BASED_OUTPATIENT_CLINIC_OR_DEPARTMENT_OTHER): Payer: Self-pay | Admitting: Otolaryngology

## 2022-06-27 ENCOUNTER — Other Ambulatory Visit: Payer: Self-pay

## 2022-07-01 NOTE — H&P (Signed)
HPI:  Helen Dalton is a 22 y.o. female who presents as a new Patient.  Referring Provider: No ref. provider found  Chief complaint: Tonsils.  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 Also history of loud snoring. Otherwise in good health.  PMH/Meds/All/SocHx/FamHx/ROS:  History reviewed. No pertinent past medical history.  History reviewed. No pertinent surgical history.  No family history of bleeding disorders, wound healing problems or difficulty with anesthesia.    No current outpatient medications on file.  A complete ROS was performed with pertinent positives/negatives noted in the HPI. The remainder of the ROS are negative.   Physical Exam:  Pulse 89  Temp 97.7 F (36.5 C) (Temporal)  Resp 18  Ht 1.676 m (5\' 6" )  Wt 89.5 kg (197 lb 6.4 oz)  SpO2 98%  BMI 31.86 kg/m  General: Healthy and alert, in no distress, breathing easily. Normal affect. In a pleasant mood. Head: Normocephalic, atraumatic. No masses, or scars. Eyes: Pupils are equal, and reactive to light. Vision is grossly intact. No spontaneous or gaze nystagmus. Ears: Ear canals are clear. Tympanic membranes are intact, with normal landmarks and the middle ears are clear and healthy. Hearing: Grossly normal. Nose: Nasal cavities are clear with healthy mucosa, no polyps or exudate. Airways are patent. Face: No masses or scars, facial nerve function is symmetric. Oral Cavity: No mucosal abnormalities are noted. Tongue with normal mobility. Dentition appears healthy. Oropharynx: Tonsils are symmetric, 3+ enlarged. There are no mucosal masses identified. Tongue base appears normal and healthy. Larynx/Hypopharynx: deferred Chest: Deferred Neck: No palpable masses, no cervical adenopathy, no thyroid nodules or enlargement. Neuro: Cranial nerves II-XII with normal function. Balance: Normal gate. Other findings: none.  Independent Review of Additional  Tests or Records: none  Procedures: none  Impression & Plans: Chronic tonsil enlargement with snoring and chronic and recurring tonsillopharyngitis. Consider tonsillectomy.Helen Dalton meets the indications for tonsillectomy. Risks and benefits were discussed in detail. All questions were answered. A handout was provided with additional details.

## 2022-07-04 ENCOUNTER — Ambulatory Visit (HOSPITAL_BASED_OUTPATIENT_CLINIC_OR_DEPARTMENT_OTHER)
Admission: RE | Admit: 2022-07-04 | Discharge: 2022-07-04 | Disposition: A | Discharge status: Home / Self Care | Payer: BC Managed Care – PPO | Attending: Otolaryngology | Admitting: Otolaryngology

## 2022-07-04 ENCOUNTER — Ambulatory Visit (HOSPITAL_BASED_OUTPATIENT_CLINIC_OR_DEPARTMENT_OTHER): Payer: BC Managed Care – PPO | Admitting: Anesthesiology

## 2022-07-04 ENCOUNTER — Encounter (HOSPITAL_BASED_OUTPATIENT_CLINIC_OR_DEPARTMENT_OTHER): Payer: Self-pay | Admitting: Otolaryngology

## 2022-07-04 ENCOUNTER — Encounter (HOSPITAL_BASED_OUTPATIENT_CLINIC_OR_DEPARTMENT_OTHER)
Admission: RE | Disposition: A | Discharge status: Home / Self Care | Payer: Self-pay | Source: Home / Self Care | Attending: Otolaryngology

## 2022-07-04 ENCOUNTER — Other Ambulatory Visit: Payer: Self-pay

## 2022-07-04 DIAGNOSIS — J3501 Chronic tonsillitis: Secondary | ICD-10-CM | POA: Diagnosis present

## 2022-07-04 DIAGNOSIS — R0683 Snoring: Secondary | ICD-10-CM | POA: Diagnosis not present

## 2022-07-04 DIAGNOSIS — Z01818 Encounter for other preprocedural examination: Secondary | ICD-10-CM

## 2022-07-04 HISTORY — PX: TONSILLECTOMY: SHX5217

## 2022-07-04 HISTORY — DX: Other specified health status: Z78.9

## 2022-07-04 HISTORY — DX: Hypertrophy of tonsils with hypertrophy of adenoids: J35.3

## 2022-07-04 LAB — POCT PREGNANCY, URINE
Preg Test, Ur: NEGATIVE
Preg Test, Ur: NEGATIVE

## 2022-07-04 SURGERY — TONSILLECTOMY
Anesthesia: General | Site: Mouth | Laterality: Bilateral

## 2022-07-04 MED ORDER — SUGAMMADEX SODIUM 500 MG/5ML IV SOLN
INTRAVENOUS | Status: DC | PRN
  Administered 2022-07-04: 200 mg via INTRAVENOUS

## 2022-07-04 MED ORDER — LACTATED RINGERS IV SOLN: INTRAVENOUS | Status: DC

## 2022-07-04 MED ORDER — ONDANSETRON HCL 4 MG/2ML IJ SOLN
INTRAMUSCULAR | Status: AC
  Filled 2022-07-04: qty 2

## 2022-07-04 MED ORDER — DEXAMETHASONE SODIUM PHOSPHATE 10 MG/ML IJ SOLN
INTRAMUSCULAR | Status: AC
  Filled 2022-07-04: qty 1

## 2022-07-04 MED ORDER — FENTANYL CITRATE (PF) 100 MCG/2ML IJ SOLN
25.0000 ug | INTRAMUSCULAR | Status: DC | PRN
  Administered 2022-07-04: 50 ug via INTRAVENOUS

## 2022-07-04 MED ORDER — FENTANYL CITRATE (PF) 100 MCG/2ML IJ SOLN
INTRAMUSCULAR | Status: DC | PRN
  Administered 2022-07-04: 100 ug via INTRAVENOUS
  Administered 2022-07-04: 50 ug via INTRAVENOUS

## 2022-07-04 MED ORDER — PROPOFOL 10 MG/ML IV BOLUS
INTRAVENOUS | Status: AC
  Filled 2022-07-04: qty 20

## 2022-07-04 MED ORDER — LIDOCAINE HCL (CARDIAC) PF 100 MG/5ML IV SOSY
PREFILLED_SYRINGE | INTRAVENOUS | Status: DC | PRN
  Administered 2022-07-04: 100 mg via INTRAVENOUS

## 2022-07-04 MED ORDER — ROCURONIUM BROMIDE 100 MG/10ML IV SOLN
INTRAVENOUS | Status: DC | PRN
  Administered 2022-07-04: 50 mg via INTRAVENOUS

## 2022-07-04 MED ORDER — FENTANYL CITRATE (PF) 100 MCG/2ML IJ SOLN
INTRAMUSCULAR | Status: AC
  Filled 2022-07-04: qty 2

## 2022-07-04 MED ORDER — LIDOCAINE 2% (20 MG/ML) 5 ML SYRINGE
INTRAMUSCULAR | Status: AC
  Filled 2022-07-04: qty 5

## 2022-07-04 MED ORDER — DEXAMETHASONE SODIUM PHOSPHATE 4 MG/ML IJ SOLN
INTRAMUSCULAR | Status: DC | PRN
  Administered 2022-07-04: 10 mg via INTRAVENOUS

## 2022-07-04 MED ORDER — ONDANSETRON 4 MG PO TBDP: 4.0000 mg | ORAL_TABLET | Freq: Three times a day (TID) | ORAL | 0 refills | Status: DC | PRN

## 2022-07-04 MED ORDER — MIDAZOLAM HCL 2 MG/2ML IJ SOLN
INTRAMUSCULAR | Status: AC
  Filled 2022-07-04: qty 2

## 2022-07-04 MED ORDER — OXYCODONE HCL 5 MG PO TABS
ORAL_TABLET | ORAL | Status: AC
  Filled 2022-07-04: qty 1

## 2022-07-04 MED ORDER — ACETAMINOPHEN 500 MG PO TABS
ORAL_TABLET | ORAL | Status: AC
  Filled 2022-07-04: qty 2

## 2022-07-04 MED ORDER — ACETAMINOPHEN 500 MG PO TABS
1000.0000 mg | ORAL_TABLET | Freq: Once | ORAL | Status: AC
  Administered 2022-07-04: 1000 mg via ORAL

## 2022-07-04 MED ORDER — OXYCODONE HCL 5 MG PO TABS
5.0000 mg | ORAL_TABLET | Freq: Once | ORAL | Status: AC
  Administered 2022-07-04: 5 mg via ORAL

## 2022-07-04 MED ORDER — MIDAZOLAM HCL 5 MG/5ML IJ SOLN
INTRAMUSCULAR | Status: DC | PRN
  Administered 2022-07-04: 2 mg via INTRAVENOUS

## 2022-07-04 MED ORDER — ONDANSETRON HCL 4 MG/2ML IJ SOLN
INTRAMUSCULAR | Status: DC | PRN
  Administered 2022-07-04: 4 mg via INTRAVENOUS

## 2022-07-04 MED ORDER — HYDROCODONE-ACETAMINOPHEN 7.5-325 MG/15ML PO SOLN: 15.0000 mL | Freq: Four times a day (QID) | ORAL | 0 refills | Status: DC | PRN

## 2022-07-04 MED ORDER — PROPOFOL 10 MG/ML IV BOLUS
INTRAVENOUS | Status: DC | PRN
  Administered 2022-07-04: 200 mg via INTRAVENOUS

## 2022-07-04 SURGICAL SUPPLY — 27 items
CANISTER SUCT 1200ML W/VALVE (MISCELLANEOUS) ×1 IMPLANT
CATH ROBINSON RED A/P 12FR (CATHETERS) ×1 IMPLANT
CLEANER CAUTERY TIP 5X5 PAD (MISCELLANEOUS) ×1 IMPLANT
COAGULATOR SUCT SWTCH 10FR 6 (ELECTROSURGICAL) ×1 IMPLANT
COVER BACK TABLE 60X90IN (DRAPES) ×1 IMPLANT
COVER MAYO STAND STRL (DRAPES) ×1 IMPLANT
DEFOGGER MIRROR 1QT (MISCELLANEOUS) IMPLANT
ELECT COATED BLADE 2.86 ST (ELECTRODE) ×1 IMPLANT
ELECT REM PT RETURN 9FT ADLT (ELECTROSURGICAL)
ELECT REM PT RETURN 9FT PED (ELECTROSURGICAL)
ELECTRODE REM PT RETRN 9FT PED (ELECTROSURGICAL) IMPLANT
ELECTRODE REM PT RTRN 9FT ADLT (ELECTROSURGICAL) IMPLANT
GAUZE SPONGE 4X4 12PLY STRL LF (GAUZE/BANDAGES/DRESSINGS) ×1 IMPLANT
GLOVE ECLIPSE 7.5 STRL STRAW (GLOVE) ×1 IMPLANT
GOWN STRL REUS W/ TWL LRG LVL3 (GOWN DISPOSABLE) ×2 IMPLANT
GOWN STRL REUS W/TWL LRG LVL3 (GOWN DISPOSABLE) ×2
MARKER SKIN DUAL TIP RULER LAB (MISCELLANEOUS) IMPLANT
NS IRRIG 1000ML POUR BTL (IV SOLUTION) ×1 IMPLANT
PENCIL FOOT CONTROL (ELECTRODE) ×1 IMPLANT
SHEET MEDIUM DRAPE 40X70 STRL (DRAPES) ×1 IMPLANT
SPONGE TONSIL 1 RF SGL (DISPOSABLE) IMPLANT
SPONGE TONSIL 1.25 RF SGL STRG (GAUZE/BANDAGES/DRESSINGS) IMPLANT
SYR BULB EAR ULCER 3OZ GRN STR (SYRINGE) ×1 IMPLANT
TOWEL GREEN STERILE FF (TOWEL DISPOSABLE) ×1 IMPLANT
TUBE CONNECTING 20X1/4 (TUBING) ×1 IMPLANT
TUBE SALEM SUMP 12FR 48 (TUBING) IMPLANT
TUBE SALEM SUMP 16F (TUBING) IMPLANT

## 2022-07-04 NOTE — Op Note (Signed)
07/04/2022  10:41 AM  PATIENT:  Helen Dalton  22 y.o. female  PRE-OPERATIVE DIAGNOSIS:  Tonsillar hypertrophy; Chronic tonsillitis  POST-OPERATIVE DIAGNOSIS:  Tonsillar hypertrophy; Chronic tonsillitis  PROCEDURE:  Procedure(s): TONSILLECTOMY  SURGEON:  Surgeon(s): Serena Colonel, MD  ANESTHESIA:   General  COUNTS: Correct   DICTATION: The patient was taken to the operating room and placed on the operating table in the supine position. Following induction of general endotracheal anesthesia, the table was turned and the patient was draped in a standard fashion. A Crowe-Davis mouthgag was inserted into the oral cavity and used to retract the tongue and mandible, then attached to the Mayo stand.  The tonsillectomy was then performed using electrocautery dissection, carefully dissecting the avascular plane between the capsule and constrictor muscles. Cautery was used for completion of hemostasis. The tonsils were large and filled with cryptic debris , and were discarded.  The pharynx was irrigated with saline and suctioned. An oral gastric tube was used to aspirate the contents of the stomach. The patient was then awakened from anesthesia and transferred to PACU in stable condition.   PATIENT DISPOSITION:  To PACA, stable

## 2022-07-04 NOTE — Anesthesia Postprocedure Evaluation (Signed)
Anesthesia Post Note  Patient: Helen Dalton  Procedure(s) Performed: TONSILLECTOMY (Bilateral: Mouth)     Patient location during evaluation: PACU Anesthesia Type: General Level of consciousness: awake and alert Pain management: pain level controlled Vital Signs Assessment: post-procedure vital signs reviewed and stable Respiratory status: spontaneous breathing, nonlabored ventilation, respiratory function stable and patient connected to nasal cannula oxygen Cardiovascular status: blood pressure returned to baseline and stable Postop Assessment: no apparent nausea or vomiting Anesthetic complications: no  No notable events documented.  Last Vitals:  Vitals:   07/04/22 1144 07/04/22 1233  BP: (!) 130/92 114/75  Pulse: 94 84  Resp: 16 15  Temp: 36.6 C 36.7 C  SpO2: 96% 96%    Last Pain:  Vitals:   07/04/22 1233  TempSrc:   PainSc: 3                  Helen Dalton

## 2022-07-04 NOTE — Anesthesia Procedure Notes (Signed)
Procedure Name: Intubation Date/Time: 07/04/2022 10:23 AM  Performed by: Karen Kitchens, CRNAPre-anesthesia Checklist: Patient identified, Emergency Drugs available, Suction available and Patient being monitored Patient Re-evaluated:Patient Re-evaluated prior to induction Oxygen Delivery Method: Circle system utilized Preoxygenation: Pre-oxygenation with 100% oxygen Induction Type: IV induction Ventilation: Mask ventilation without difficulty Laryngoscope Size: Mac and 4 Grade View: Grade I Tube type: Oral Number of attempts: 1 Airway Equipment and Method: Stylet and Oral airway Placement Confirmation: ETT inserted through vocal cords under direct vision, positive ETCO2, breath sounds checked- equal and bilateral and CO2 detector Secured at: 24 cm Tube secured with: Tape Dental Injury: Teeth and Oropharynx as per pre-operative assessment

## 2022-07-04 NOTE — Discharge Instructions (Signed)
  Post Anesthesia Home Care Instructions  Activity: Get plenty of rest for the remainder of the day. A responsible individual must stay with you for 24 hours following the procedure.  For the next 24 hours, DO NOT: -Drive a car -Operate machinery -Drink alcoholic beverages -Take any medication unless instructed by your physician -Make any legal decisions or sign important papers.  Meals: Start with liquid foods such as gelatin or soup. Progress to regular foods as tolerated. Avoid greasy, spicy, heavy foods. If nausea and/or vomiting occur, drink only clear liquids until the nausea and/or vomiting subsides. Call your physician if vomiting continues.  Special Instructions/Symptoms: Your throat may feel dry or sore from the anesthesia or the breathing tube placed in your throat during surgery. If this causes discomfort, gargle with warm salt water. The discomfort should disappear within 24 hours.  If you had a scopolamine patch placed behind your ear for the management of post- operative nausea and/or vomiting:  1. The medication in the patch is effective for 72 hours, after which it should be removed.  Wrap patch in a tissue and discard in the trash. Wash hands thoroughly with soap and water. 2. You may remove the patch earlier than 72 hours if you experience unpleasant side effects which may include dry mouth, dizziness or visual disturbances. 3. Avoid touching the patch. Wash your hands with soap and water after contact with the patch.  No tylenol until after 3pm today if needed.    

## 2022-07-04 NOTE — Transfer of Care (Signed)
Immediate Anesthesia Transfer of Care Note  Patient: Helen Dalton  Procedure(s) Performed: TONSILLECTOMY (Bilateral: Mouth)  Patient Location: PACU  Anesthesia Type:General  Level of Consciousness: awake and alert   Airway & Oxygen Therapy: Patient Spontanous Breathing and Patient connected to face mask oxygen  Post-op Assessment: Report given to RN and Post -op Vital signs reviewed and stable  Post vital signs: Reviewed and stable  Last Vitals:  Vitals Value Taken Time  BP 134/81 07/04/22 1047  Temp    Pulse 105 07/04/22 1048  Resp 16 07/04/22 1048  SpO2 88 % 07/04/22 1048  Vitals shown include unvalidated device data.  Last Pain:  Vitals:   07/04/22 0902  TempSrc: Temporal  PainSc: 0-No pain      Patients Stated Pain Goal: 4 (07/04/22 0902)  Complications: No notable events documented.

## 2022-07-04 NOTE — Anesthesia Preprocedure Evaluation (Addendum)
Anesthesia Evaluation  Patient identified by MRN, date of birth, ID band Patient awake    Reviewed: Allergy & Precautions, NPO status , Patient's Chart, lab work & pertinent test results  Airway Mallampati: I  TM Distance: >3 FB Neck ROM: Full    Dental no notable dental hx. (+) Teeth Intact, Dental Advisory Given   Pulmonary neg pulmonary ROS   Pulmonary exam normal breath sounds clear to auscultation       Cardiovascular negative cardio ROS Normal cardiovascular exam Rhythm:Regular Rate:Normal     Neuro/Psych negative neurological ROS  negative psych ROS   GI/Hepatic negative GI ROS, Neg liver ROS,,,  Endo/Other  negative endocrine ROS    Renal/GU negative Renal ROS  negative genitourinary   Musculoskeletal negative musculoskeletal ROS (+)    Abdominal   Peds  Hematology negative hematology ROS (+)   Anesthesia Other Findings   Reproductive/Obstetrics                             Anesthesia Physical Anesthesia Plan  ASA: 1  Anesthesia Plan: General   Post-op Pain Management: Tylenol PO (pre-op)*   Induction: Intravenous  PONV Risk Score and Plan: 3 and Midazolam, Dexamethasone and Ondansetron  Airway Management Planned: Oral ETT  Additional Equipment:   Intra-op Plan:   Post-operative Plan: Extubation in OR  Informed Consent: I have reviewed the patients History and Physical, chart, labs and discussed the procedure including the risks, benefits and alternatives for the proposed anesthesia with the patient or authorized representative who has indicated his/her understanding and acceptance.     Dental advisory given  Plan Discussed with: CRNA  Anesthesia Plan Comments:        Anesthesia Quick Evaluation  

## 2022-07-04 NOTE — Interval H&P Note (Signed)
History and Physical Interval Note:  07/04/2022 9:48 AM  Ralene Ok  has presented today for surgery, with the diagnosis of Tonsillar hypertrophy; Chronic tonsillitis.  The various methods of treatment have been discussed with the patient and family. After consideration of risks, benefits and other options for treatment, the patient has consented to  Procedure(s): TONSILLECTOMY (Bilateral) as a surgical intervention.  The patient's history has been reviewed, patient examined, no change in status, stable for surgery.  I have reviewed the patient's chart and labs.  Questions were answered to the patient's satisfaction.     Helen Dalton

## 2022-07-05 ENCOUNTER — Encounter (HOSPITAL_BASED_OUTPATIENT_CLINIC_OR_DEPARTMENT_OTHER): Payer: Self-pay | Admitting: Otolaryngology

## 2022-09-08 ENCOUNTER — Ambulatory Visit
Admission: RE | Admit: 2022-09-08 | Discharge: 2022-09-08 | Disposition: A | Discharge status: Home / Self Care | Payer: BC Managed Care – PPO | Source: Ambulatory Visit | Attending: Internal Medicine | Admitting: Internal Medicine

## 2022-09-08 VITALS — BP 121/83 | HR 91 | Temp 98.1°F | Resp 18

## 2022-09-08 DIAGNOSIS — R1011 Right upper quadrant pain: Secondary | ICD-10-CM | POA: Diagnosis not present

## 2022-09-08 DIAGNOSIS — Z3202 Encounter for pregnancy test, result negative: Secondary | ICD-10-CM | POA: Insufficient documentation

## 2022-09-08 DIAGNOSIS — R1030 Lower abdominal pain, unspecified: Secondary | ICD-10-CM | POA: Diagnosis present

## 2022-09-08 DIAGNOSIS — Z113 Encounter for screening for infections with a predominantly sexual mode of transmission: Secondary | ICD-10-CM | POA: Diagnosis not present

## 2022-09-08 LAB — POCT URINALYSIS DIP (MANUAL ENTRY)
Bilirubin, UA: NEGATIVE
Blood, UA: NEGATIVE
Glucose, UA: NEGATIVE mg/dL
Leukocytes, UA: NEGATIVE
Nitrite, UA: NEGATIVE
Protein Ur, POC: 100 mg/dL — AB
Spec Grav, UA: 1.02 (ref 1.010–1.025)
Urobilinogen, UA: 0.2 E.U./dL
pH, UA: 8 (ref 5.0–8.0)

## 2022-09-08 LAB — POCT URINE PREGNANCY: Preg Test, Ur: NEGATIVE

## 2022-09-08 NOTE — Discharge Instructions (Signed)
Urine test was clear.  Vaginal swab and blood work are pending.  Will call if they are abnormal.  Please go straight to the emergency department if symptoms persist or worsen.

## 2022-09-08 NOTE — ED Triage Notes (Addendum)
Pt reports abdominal pain and pressure x 3 days. Pt thinks she may have a UTI. Denies any urinary symptoms.

## 2022-09-08 NOTE — ED Provider Notes (Signed)
[facility]    CSN: [id number] Arrival date & time: 09/08/22  1742      History   Chief Complaint Chief Complaint  Patient presents with   Abdominal Pain    Entered by patient    HPI Helen Dalton is a 22 y.o. female.   Presents today with abdominal pain that started about 3 days ago.  Patient states that it started in her lower abdomen and is now seemed to move up to right upper abdomen.  She also reports that she still has some persistent lower abdominal pain as well.  States that it feels like a bladder pressure at times.  Reports that she had some dysuria when symptoms for started which is now resolved.  Denies urinary frequency, hematuria, vaginal discharge.  Patient has had unprotected sexual intercourse but denies exposure to STD.  Last menstrual cycle was around 08/24/2022.  She does not use any form of birth control.  Denies nausea, vomiting, diarrhea.  Patient having normal bowel movements with no blood in stool.  Denies fever.   Abdominal Pain   Past Medical History:  Diagnosis Date   Enlarged tonsils and adenoids    Medical history non-contributory     There are no problems to display for this patient.   Past Surgical History:  Procedure Laterality Date   NO PAST SURGERIES     TONSILLECTOMY Bilateral 07/04/2022   Procedure: TONSILLECTOMY;  Surgeon: Serena Colonel, MD;  Location: [facility];  Service: ENT;  Laterality: Bilateral;    OB History   No obstetric history on file.      Home Medications    Prior to Admission medications   Medication Sig Start Date End Date Taking? Authorizing Provider  clindamycin (CLEOCIN T) 1 % lotion Apply topically every morning. 04/07/21   [provider]  HYDROcodone-acetaminophen (HYCET) 7.5-325 mg/15 ml solution Take 15 mLs by mouth every 6 (six) hours as needed for moderate pain. 07/04/22   Serena Colonel, MD  ibuprofen (ADVIL) 600 MG tablet Take 1 tablet (600 mg total) by mouth every  8 (eight) hours as needed. 10/26/21   Ellsworth Lennox, PA-C  ondansetron (ZOFRAN-ODT) 4 MG disintegrating tablet Take 1 tablet (4 mg total) by mouth every 8 (eight) hours as needed for nausea or vomiting. 07/04/22   Serena Colonel, MD  tretinoin (RETIN-A) 0.025 % cream Apply topically at bedtime. 04/07/21   [provider]    Family History History reviewed. No pertinent family history.  Social History Social History   Tobacco Use   Smoking status: Never   Smokeless tobacco: Never  Vaping Use   Vaping status: Every Day  Substance Use Topics   Alcohol use: Yes    Comment: occ   Drug use: Never     Allergies   Patient has no known allergies.   Review of Systems Review of Systems Per HPI  Physical Exam Triage Vital Signs ED Triage Vitals [09/08/22 1750]  Encounter Vitals Group     BP 121/83     Systolic BP Percentile      Diastolic BP Percentile      Pulse Rate 91     Resp 18     Temp 98.1 F (36.7 C)     Temp Source Oral     SpO2 98 %     Weight      Height      Head Circumference      Peak Flow      Pain  Score      Pain Loc      Pain Education      Exclude from Growth Chart    No data found.  Updated Vital Signs BP 121/83 (BP Location: Left Arm)   Pulse 91   Temp 98.1 F (36.7 C) (Oral)   Resp 18   LMP 08/25/2022 (Approximate)   SpO2 98%   Visual Acuity Right Eye Distance:   Left Eye Distance:   Bilateral Distance:    Right Eye Near:   Left Eye Near:    Bilateral Near:     Physical Exam Constitutional:      General: She is not in acute distress.    Appearance: Normal appearance. She is not toxic-appearing or diaphoretic.  HENT:     Head: Normocephalic and atraumatic.  Eyes:     Extraocular Movements: Extraocular movements intact.     Conjunctiva/sclera: Conjunctivae normal.  Cardiovascular:     Rate and Rhythm: Normal rate and regular rhythm.     Pulses: Normal pulses.     Heart sounds: Normal heart sounds.  Pulmonary:      Effort: Pulmonary effort is normal. No respiratory distress.     Breath sounds: Normal breath sounds. No stridor. No wheezing, rhonchi or rales.  Abdominal:     General: Bowel sounds are normal. There is no distension.     Palpations: Abdomen is soft.     Tenderness: There is abdominal tenderness.     Comments: Patient has mild tenderness to palpation to lower abdomen.  No obvious tenderness to palpation to right upper quadrant.  Genitourinary:    Comments: Deferred with shared decision making. Self swab performed.  Neurological:     General: No focal deficit present.     Mental Status: She is alert and oriented to person, place, and time. Mental status is at baseline.  Psychiatric:        Mood and Affect: Mood normal.        Behavior: Behavior normal.        Thought Content: Thought content normal.        Judgment: Judgment normal.      UC Treatments / Results  Labs (all labs ordered are listed, but only abnormal results are displayed) Labs Reviewed  POCT URINALYSIS DIP (MANUAL ENTRY) - Abnormal; Notable for the following components:      Result Value   Clarity, UA hazy (*)    Ketones, POC UA trace (5) (*)    Protein Ur, POC =100 (*)    All other components within normal limits  COMPREHENSIVE METABOLIC PANEL  CBC  POCT URINE PREGNANCY  CERVICOVAGINAL ANCILLARY ONLY    EKG   Radiology No results found.  Procedures Procedures (including critical care time)  Medications Ordered in UC Medications - No data to display  Initial Impression / Assessment and Plan / UC Course  I have reviewed the triage vital signs and the nursing notes.  Pertinent labs & imaging results that were available during my care of the patient were reviewed by me and considered in my medical decision making (see chart for details).     There are no obvious signs of acute abdomen on exam that would need emergent evaluation or imaging at this time.  UA unremarkable.  Will send cervicovaginal  swab to ensure that abdominal pain is not due to any form of vaginitis or pelvic inflammatory disease.  Pregnancy test was negative.  CMP and CBC pending especially given patient is having  right upper quadrant abdominal pain.  Differential diagnoses include UTI versus vaginitis versus pelvic inflammatory disease.  Will await results prior to any additional treatment.  Patient was given strict ER precautions.  Patient verbalized understanding and was agreeable with plan. Final Clinical Impressions(s) / UC Diagnoses   Final diagnoses:  Lower abdominal pain  Right upper quadrant abdominal pain  Screening examination for venereal disease  Urine pregnancy test negative     Discharge Instructions      Urine test was clear.  Vaginal swab and blood work are pending.  Will call if they are abnormal.  Please go straight to the emergency department if symptoms persist or worsen.    ED Prescriptions   None    PDMP not reviewed this encounter.   Gustavus Bryant, [place] 09/08/22 [phone]

## 2022-09-09 ENCOUNTER — Telehealth: Payer: Self-pay

## 2022-09-09 LAB — COMPREHENSIVE METABOLIC PANEL
ALT: 10 IU/L (ref 0–32)
AST: 16 IU/L (ref 0–40)
Albumin: 4.4 g/dL (ref 4.0–5.0)
Alkaline Phosphatase: 79 IU/L (ref 44–121)
BUN/Creatinine Ratio: 12 (ref 9–23)
BUN: 9 mg/dL (ref 6–20)
Bilirubin Total: <0.2 mg/dL (ref 0.0–1.2)
CO2: 21 mmol/L (ref 20–29)
Calcium: 9.5 mg/dL (ref 8.7–10.2)
Chloride: 101 mmol/L (ref 96–106)
Creatinine, Ser: 0.78 mg/dL (ref 0.57–1.00)
Globulin, Total: 2.9 g/dL (ref 1.5–4.5)
Glucose: 61 mg/dL — ABNORMAL LOW (ref 70–99)
Potassium: 4.2 mmol/L (ref 3.5–5.2)
Sodium: 140 mmol/L (ref 134–144)
Total Protein: 7.3 g/dL (ref 6.0–8.5)
eGFR: 109 mL/min/{1.73_m2} (ref >59)

## 2022-09-09 LAB — CBC
Hematocrit: 36.1 % (ref 34.0–46.6)
Hemoglobin: 11.7 g/dL (ref 11.1–15.9)
MCH: 26.7 pg (ref 26.6–33.0)
MCHC: 32.4 g/dL (ref 31.5–35.7)
MCV: 82 fL (ref 79–97)
Platelets: 269 10*3/uL (ref 150–450)
RBC: 4.38 x10E6/uL (ref 3.77–5.28)
RDW: 13 % (ref 11.7–15.4)
WBC: 11 10*3/uL — ABNORMAL HIGH (ref 3.4–10.8)

## 2022-09-09 MED ORDER — FLUCONAZOLE 150 MG PO TABS: 150.0000 mg | ORAL_TABLET | Freq: Once | ORAL | 0 refills | Status: AC

## 2022-09-09 NOTE — Telephone Encounter (Signed)
Per protocol, pt requires tx with Diflucan. Attempted to reach patient x1. LVM. Rx sent to pharmacy on file.

## 2022-09-15 LAB — CERVICOVAGINAL ANCILLARY ONLY
Bacterial Vaginitis (gardnerella): NEGATIVE
Candida Glabrata: NEGATIVE
Candida Vaginitis: POSITIVE — AB
Chlamydia: NEGATIVE
Comment: NEGATIVE
Comment: NEGATIVE
Comment: NEGATIVE
Comment: NEGATIVE
Comment: NEGATIVE
Comment: NORMAL
Neisseria Gonorrhea: NEGATIVE
Trichomonas: NEGATIVE

## 2023-01-16 ENCOUNTER — Other Ambulatory Visit: Payer: Self-pay

## 2023-01-16 ENCOUNTER — Ambulatory Visit
Admission: RE | Admit: 2023-01-16 | Discharge: 2023-01-16 | Disposition: A | Discharge status: Home / Self Care | Payer: BC Managed Care – PPO | Source: Ambulatory Visit | Attending: Family Medicine | Admitting: Family Medicine

## 2023-01-16 VITALS — BP 124/86 | HR 92 | Temp 99.0°F | Resp 18

## 2023-01-16 DIAGNOSIS — N3000 Acute cystitis without hematuria: Secondary | ICD-10-CM | POA: Insufficient documentation

## 2023-01-16 LAB — POCT URINALYSIS DIP (MANUAL ENTRY)
Bilirubin, UA: NEGATIVE
Glucose, UA: NEGATIVE mg/dL
Ketones, POC UA: NEGATIVE mg/dL
Nitrite, UA: NEGATIVE
Protein Ur, POC: NEGATIVE mg/dL
Spec Grav, UA: 1.02 (ref 1.010–1.025)
Urobilinogen, UA: 0.2 U/dL
pH, UA: 7 (ref 5.0–8.0)

## 2023-01-16 LAB — POCT URINE PREGNANCY: Preg Test, Ur: NEGATIVE

## 2023-01-16 MED ORDER — CEPHALEXIN 500 MG PO CAPS: 500.0000 mg | ORAL_CAPSULE | Freq: Two times a day (BID) | ORAL | 0 refills | Status: AC

## 2023-01-16 MED ORDER — FLUCONAZOLE 150 MG PO TABS: 150.0000 mg | ORAL_TABLET | ORAL | 0 refills | Status: DC | PRN

## 2023-01-16 NOTE — Discharge Instructions (Addendum)
 Urine culture typically result 2-3 days, we will notify you if any treatment changes are indicated.

## 2023-01-16 NOTE — ED Triage Notes (Signed)
 Urinary frequency and urgency x 2 days. Taking AZO

## 2023-01-16 NOTE — ED Provider Notes (Signed)
 [facility]    CSN: [id number] Arrival date & time: 01/16/23  1846      History   Chief Complaint Chief Complaint  Patient presents with   Urinary Frequency    Entered by patient    HPI Helen Dalton is a 23 y.o. female.   Helen Dalton is a 23 y.o. female presents for evaluation of urinary frequency, urgency and dysuria x 2 days, without flank pain, fever, chills, or abnormal vaginal discharge or bleeding. Reports no concern for STD. Previous history of UTI and reports failure with none known antibiotic. Uncertain of prior urine pathology. Patient's last menstrual period was 01/03/2023.    Past Medical History:  Diagnosis Date   Enlarged tonsils and adenoids    Medical history non-contributory     There are no active problems to display for this patient.   Past Surgical History:  Procedure Laterality Date   NO PAST SURGERIES     TONSILLECTOMY Bilateral 07/04/2022   Procedure: TONSILLECTOMY;  Surgeon: Jesus Oliphant, MD;  [facility];  Service: ENT;  Laterality: Bilateral;    OB History   No obstetric history on file.      Home Medications    Prior to Admission medications   Medication Sig Start Date End Date Taking? Authorizing Provider  cephALEXin  (KEFLEX ) 500 MG capsule Take 1 capsule (500 mg total) by mouth 2 (two) times daily for 5 days. 01/16/23 01/21/23 Yes Arloa Suzen RAMAN, NP  clindamycin (CLEOCIN T) 1 % lotion Apply topically every morning. 04/07/21  Yes [provider]  fluconazole  (DIFLUCAN ) 150 MG tablet Take 1 tablet (150 mg total) by mouth every three (3) days as needed (yeast). Repeat if needed 01/16/23  Yes Arloa Suzen RAMAN, NP  tretinoin (RETIN-A) 0.025 % cream Apply topically at bedtime. 04/07/21  Yes [provider]  HYDROcodone -acetaminophen  (HYCET) 7.5-325 mg/15 ml solution Take 15 mLs by mouth every 6 (six) hours as needed for moderate pain. Patient not taking: Reported on 01/16/2023  07/04/22   Jesus Oliphant, MD  ibuprofen  (ADVIL ) 600 MG tablet Take 1 tablet (600 mg total) by mouth every 8 (eight) hours as needed. Patient not taking: Reported on 01/16/2023 10/26/21   Lynwood Lenis, PA-C  ondansetron  (ZOFRAN -ODT) 4 MG disintegrating tablet Take 1 tablet (4 mg total) by mouth every 8 (eight) hours as needed for nausea or vomiting. Patient not taking: Reported on 01/16/2023 07/04/22   Jesus Oliphant, MD    Family History History reviewed. No pertinent family history.  Social History Social History   Tobacco Use   Smoking status: Never   Smokeless tobacco: Never  Vaping Use   Vaping status: Every Day  Substance Use Topics   Alcohol use: Yes    Comment: occ   Drug use: Never     Allergies   Patient has no known allergies.   Review of Systems Review of Systems  Genitourinary:  Positive for frequency.     Physical Exam Triage Vital Signs ED Triage Vitals  Encounter Vitals Group     BP 01/16/23 1853 124/86     Systolic BP Percentile --      Diastolic BP Percentile --      Pulse Rate 01/16/23 1853 92     Resp 01/16/23 1853 18     Temp 01/16/23 1853 99 F (37.2 C)     Temp Source 01/16/23 1853 Oral     SpO2 01/16/23 1853 97 %     Weight --  Height --      Head Circumference --      Peak Flow --      Pain Score 01/16/23 1851 3     Pain Loc --      Pain Education --      Exclude from Growth Chart --    No data found.  Updated Vital Signs BP 124/86 (BP Location: Left Arm)   Pulse 92   Temp 99 F (37.2 C) (Oral)   Resp 18   LMP 01/03/2023   SpO2 97%   Visual Acuity Right Eye Distance:   Left Eye Distance:   Bilateral Distance:    Right Eye Near:   Left Eye Near:    Bilateral Near:     Physical Exam Vitals reviewed.  Constitutional:      Appearance: Normal appearance.  HENT:     Head: Normocephalic and atraumatic.  Eyes:     Extraocular Movements: Extraocular movements intact.     Conjunctiva/sclera: Conjunctivae normal.      Pupils: Pupils are equal, round, and reactive to light.  Cardiovascular:     Rate and Rhythm: Normal rate and regular rhythm.  Pulmonary:     Effort: Pulmonary effort is normal.     Breath sounds: Normal breath sounds.  Abdominal:     Tenderness: There is no right CVA tenderness or left CVA tenderness.  Skin:    General: Skin is warm and dry.     Capillary Refill: Capillary refill takes less than 2 seconds.  Neurological:     General: No focal deficit present.     Mental Status: She is alert.      UC Treatments / Results  Labs (all labs ordered are listed, but only abnormal results are displayed) Labs Reviewed  POCT URINALYSIS DIP (MANUAL ENTRY) - Abnormal; Notable for the following components:      Result Value   Blood, UA trace-intact (*)    Leukocytes, UA Trace (*)    All other components within normal limits  URINE CULTURE  POCT URINE PREGNANCY    EKG   Radiology No results found.  Procedures Procedures (including critical care time)  Medications Ordered in UC Medications - No data to display  Initial Impression / Assessment and Plan / UC Course  I have reviewed the triage vital signs and the nursing notes.  Pertinent labs & imaging results that were available during my care of the patient were reviewed by me and considered in my medical decision making (see chart for details).     Acute cystitis without Hematuria  UA abnormal and findings consistent with UTI. Urine Culture pending.  Empiric antibiotic treatment initiated. Encouraged increase intake of water.  ER if symptoms become severe. Follow-up with PCP if symptoms do not completely resolve.  Final Clinical Impressions(s) / UC Diagnoses   Final diagnoses:  Acute cystitis without hematuria     Discharge Instructions      Urine culture typically result 2-3 days, we will notify you if any treatment changes are indicated.     ED Prescriptions     Medication Sig Dispense Auth. Provider    cephALEXin  (KEFLEX ) 500 MG capsule Take 1 capsule (500 mg total) by mouth 2 (two) times daily for 5 days. 10 capsule Arloa Suzen RAMAN, NP   fluconazole  (DIFLUCAN ) 150 MG tablet Take 1 tablet (150 mg total) by mouth every three (3) days as needed (yeast). Repeat if needed 2 tablet Arloa Suzen RAMAN, NP  PDMP not reviewed this encounter.   Arloa Suzen RAMAN, NP 01/17/23 [phone]

## 2023-01-17 LAB — URINE CULTURE
Culture: NO GROWTH
Special Requests: NORMAL

## 2023-03-28 IMAGING — US US PELVIS COMPLETE WITH TRANSVAGINAL
1 series · 14 of 25 positions shown · non-contrast
Comparison: None

CLINICAL DATA: Pelvic pain for 1 week with spotting



[Series 1: us pelvic complete with transvaginal · 14 of 76 slices shown]
[im 1/76]
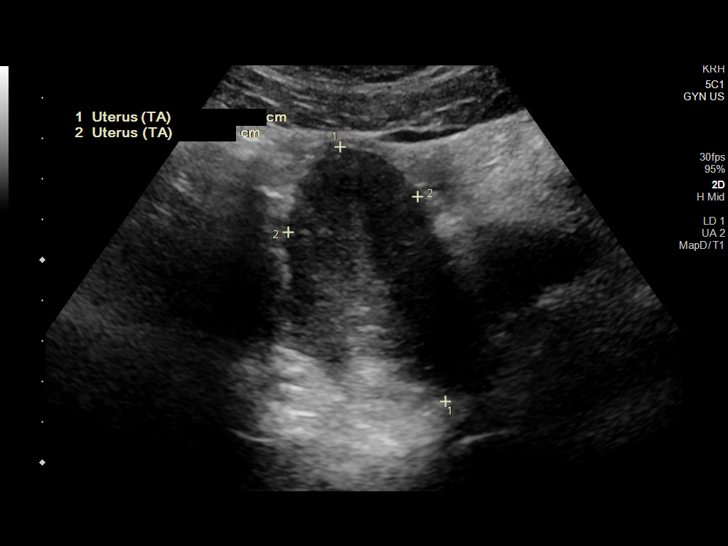
[im 7/76]
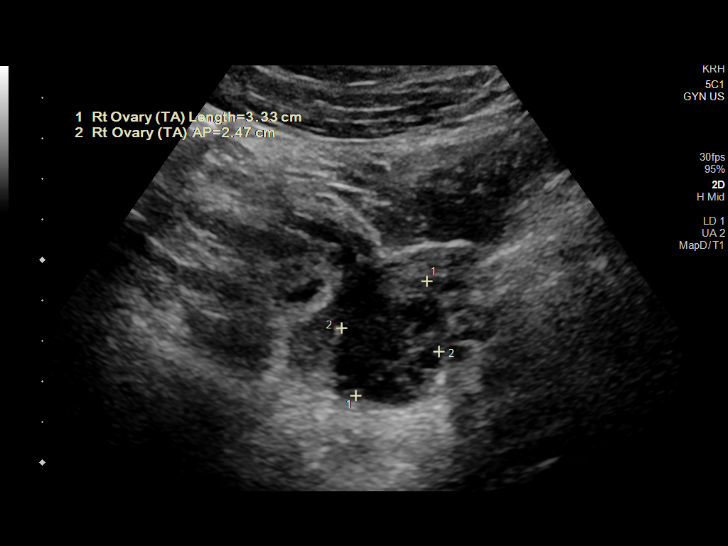
[im 13/76]
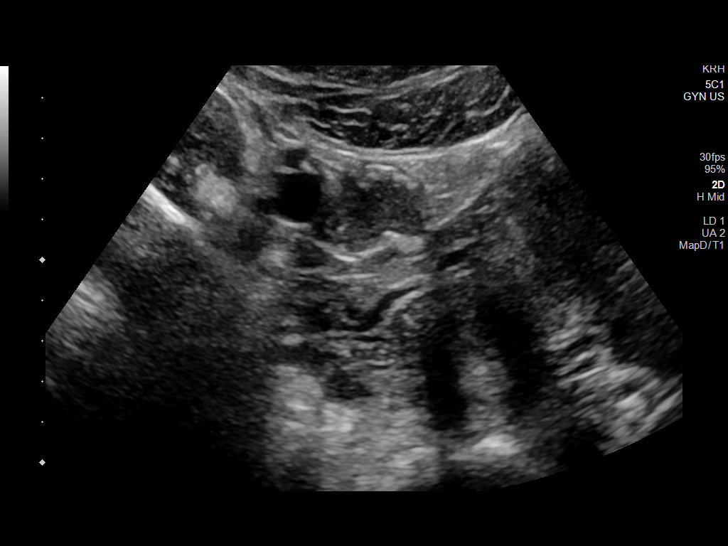
[im 19/76]
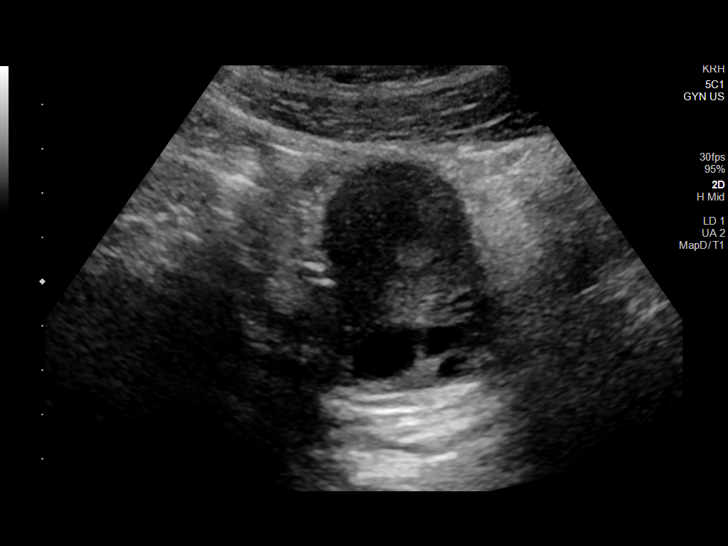
[im 26/76]
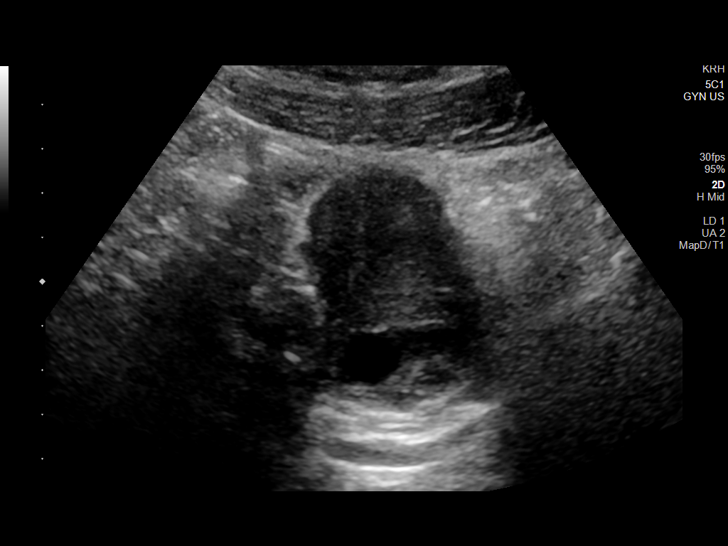
[im 29/76]
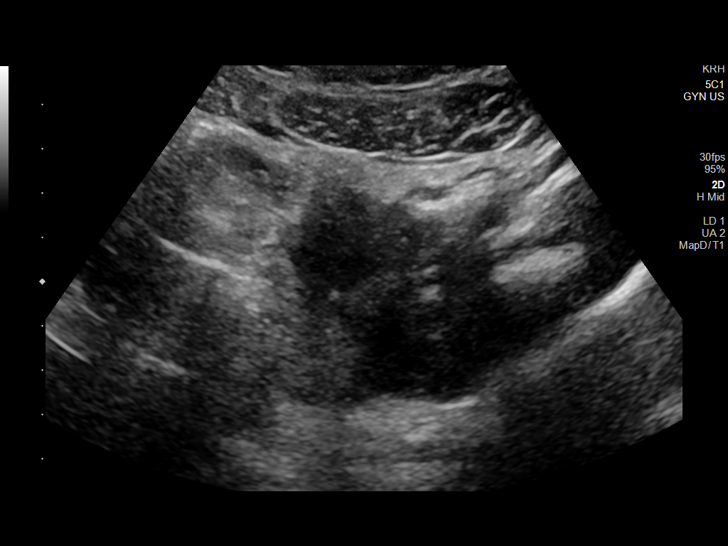
[im 35/76]
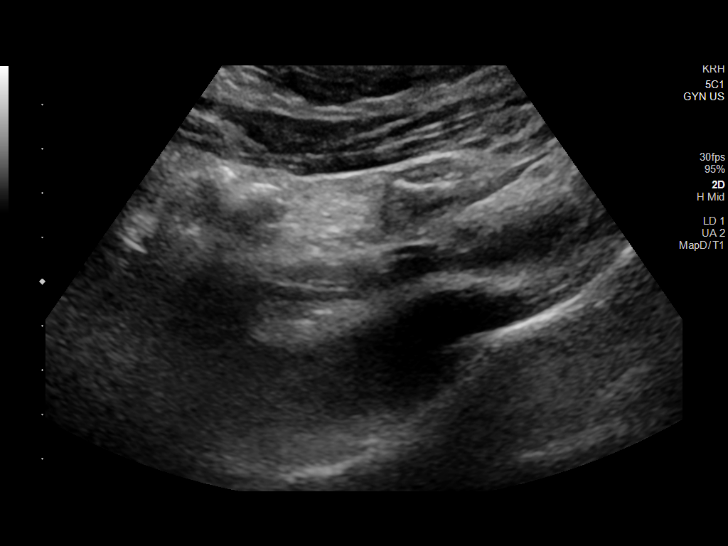
[im 41/76]
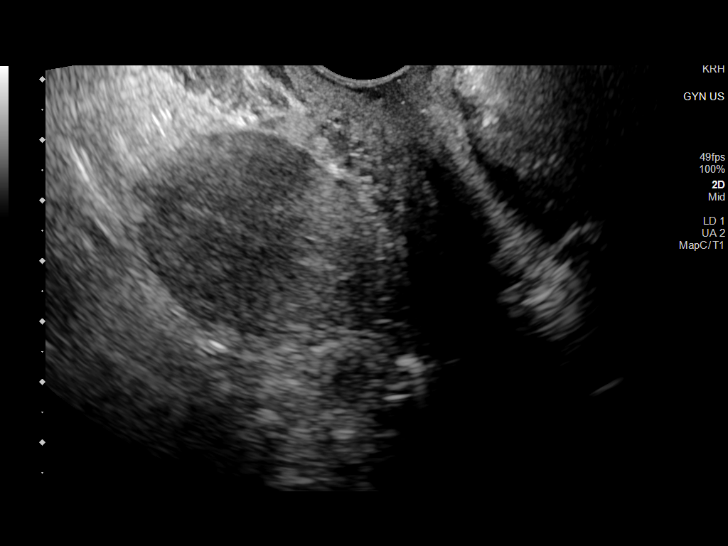
[im 47/76]
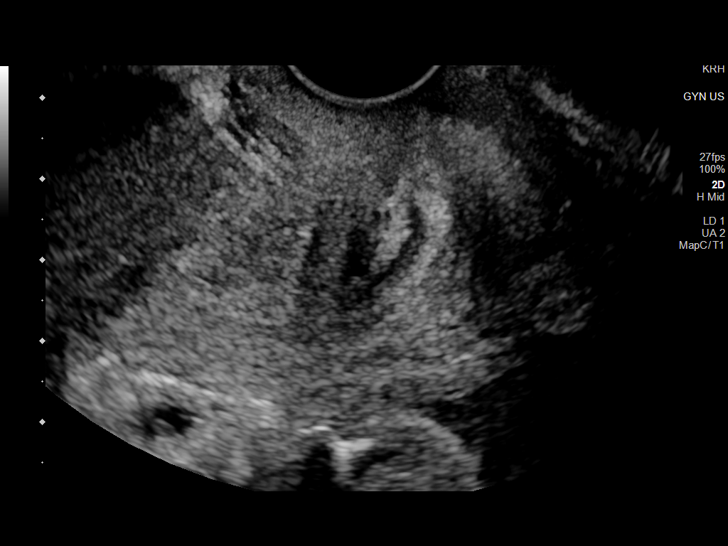
[im 51/76]
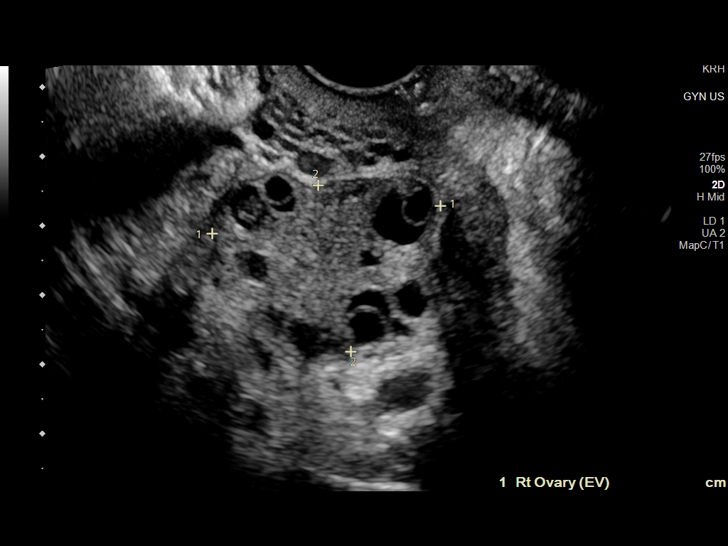
[im 57/76]
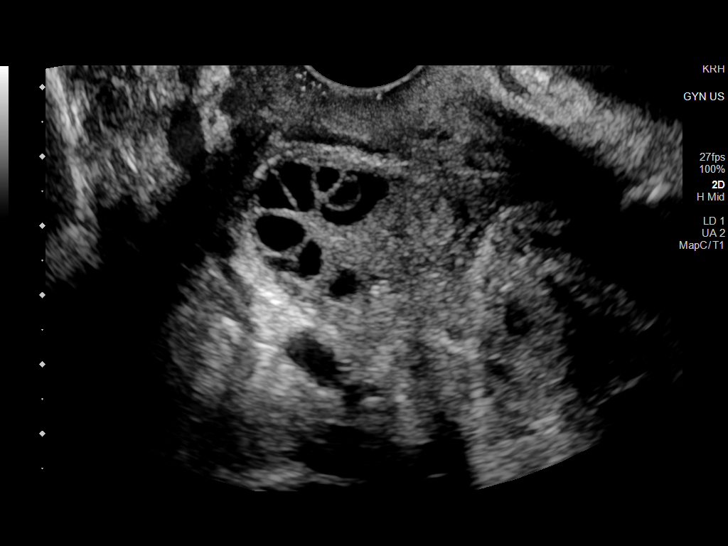
[im 63/76]
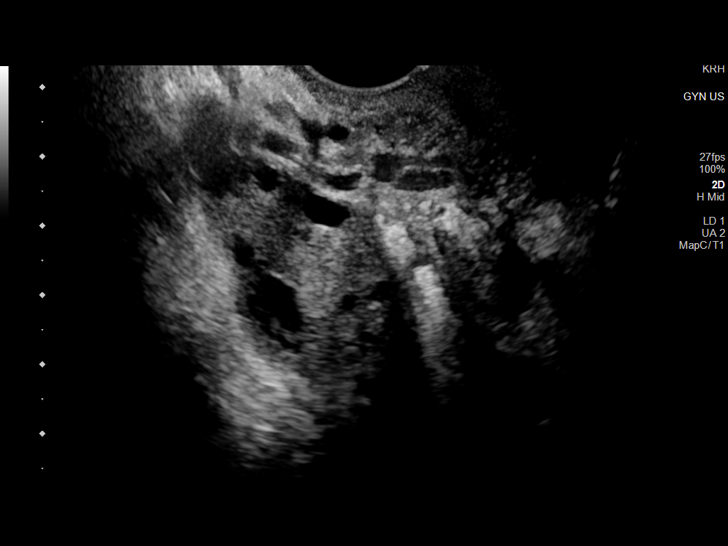
[im 69/76]
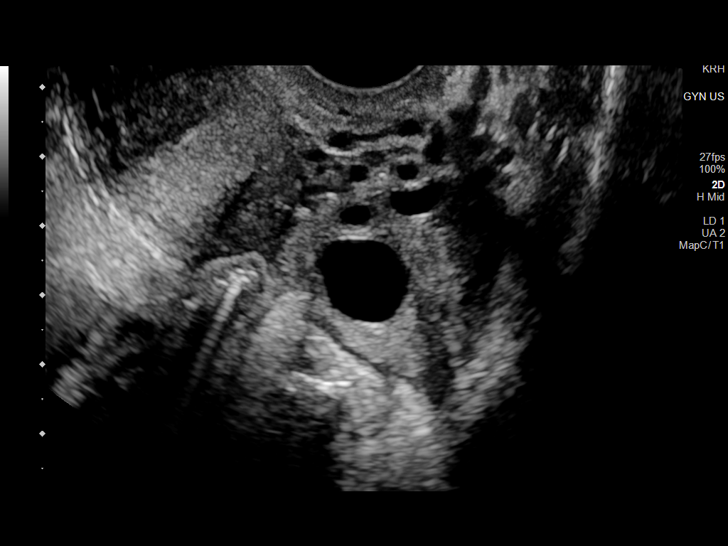
[im 76/76]
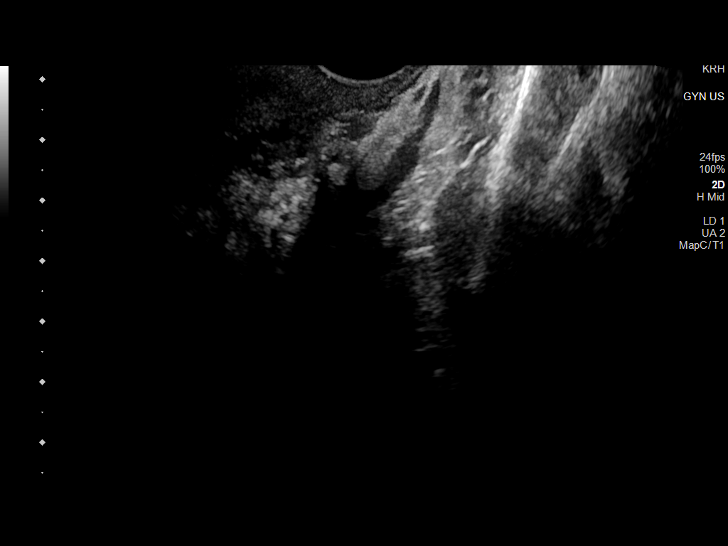

[14 of 25 positions shown; findings below may reference images not displayed]

FINDINGS: Uterus

Measurements: 7.2 x 3.6 x 4.1 cm = volume: 56 mL. Anteverted. No
fibroids or other mass visualized.

Endometrium

Thickness: 10 mm.  No focal abnormality visualized.

Right ovary

Measurements: 3.3 x 2.5 x 2.7 cm = volume: 12 mL. Multiple small
follicles. Normal appearance/no adnexal mass.

Left ovary

Measurements: 3.7 x 2.6 x 2.6 cm = volume: 13 mL. Multiple small
follicles. Normal appearance/no adnexal mass.

Other findings

No abnormal free fluid.
IMPRESSION: Unremarkable pelvic ultrasound. No findings to explain patient's
pain.

## 2023-03-28 IMAGING — CT CT ABD-PELV W/ CM
2 of 4 series · 15 of 46 positions shown, 17 images · IV contrast (omnipaque)
Comparison: None.

CLINICAL DATA: Right lower quadrant abdominal pain following
menstrual period two weeks ago

EXAM:
CT ABDOMEN AND PELVIS WITH CONTRAST
TECHNIQUE: Multidetector CT imaging of the abdomen and pelvis was performed
using the standard protocol following bolus administration of
intravenous contrast.
CONTRAST:  100mL OMNIPAQUE IOHEXOL 300 MG/ML  SOLN

[Series 2: abd pel w · axial · 0.83mm/px · z∈[+730,+1140]mm · 12 of 94 slices shown, 14 images]
[im 8/94  soft-tissue]
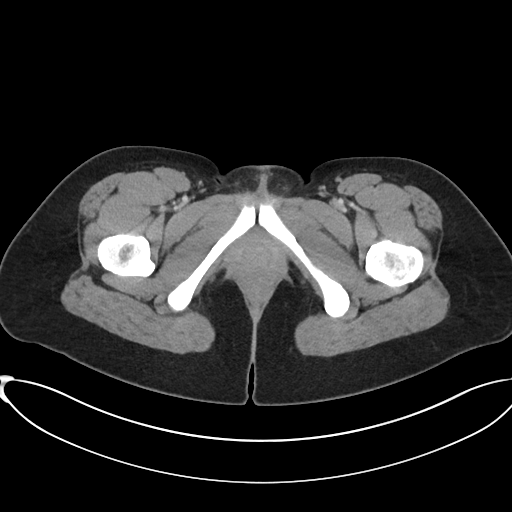
[im 8/94  bone]
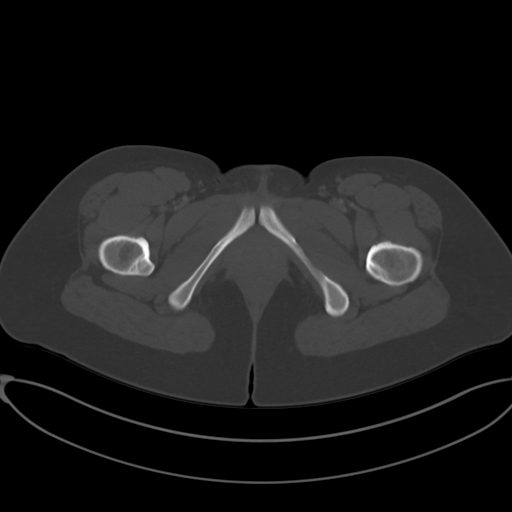
[im 15/94  soft-tissue]
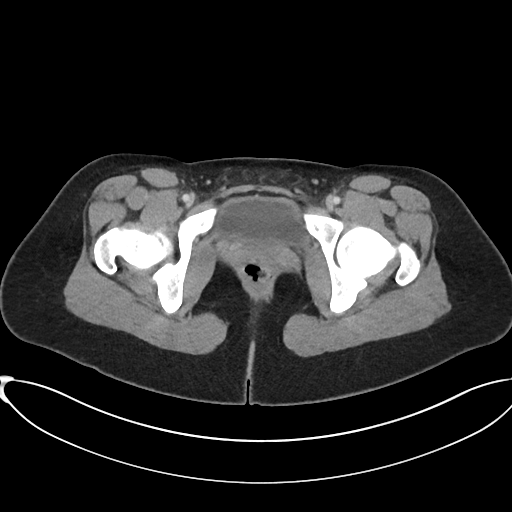
[im 23/94  soft-tissue]
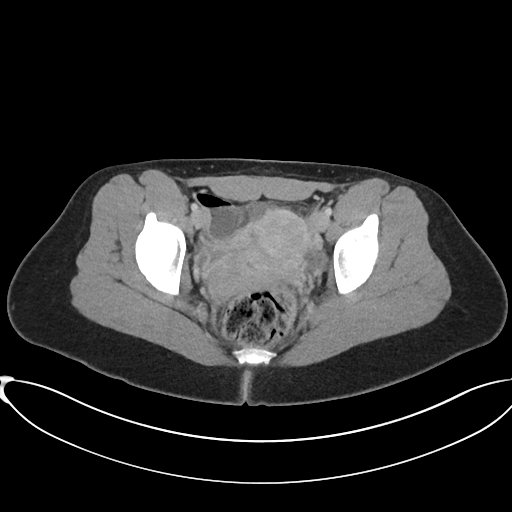
[im 30/94  soft-tissue]
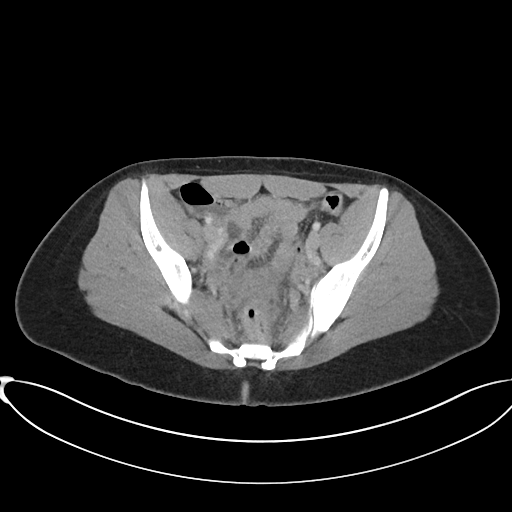
[im 38/94  soft-tissue]
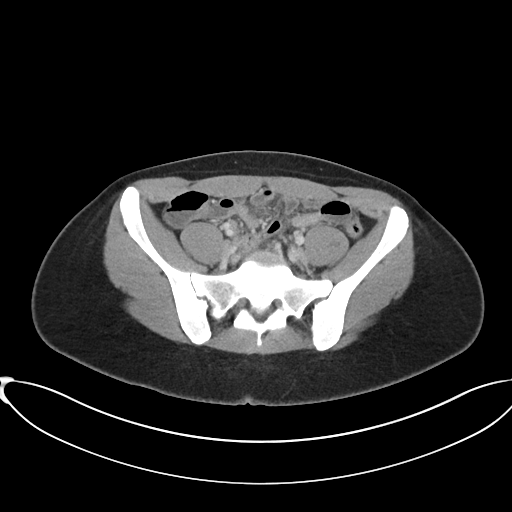
[im 45/94  soft-tissue]
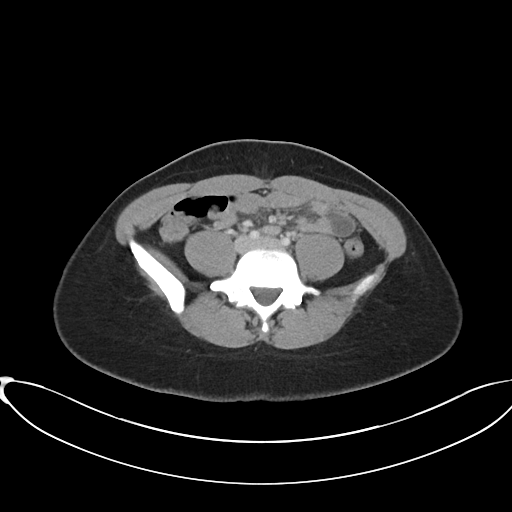
[im 53/94  soft-tissue]
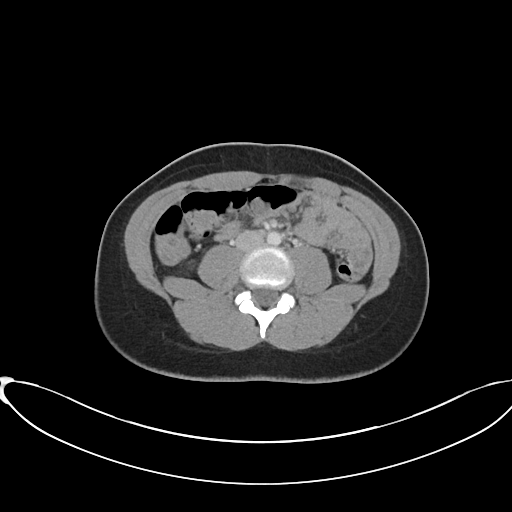
[im 60/94  soft-tissue]
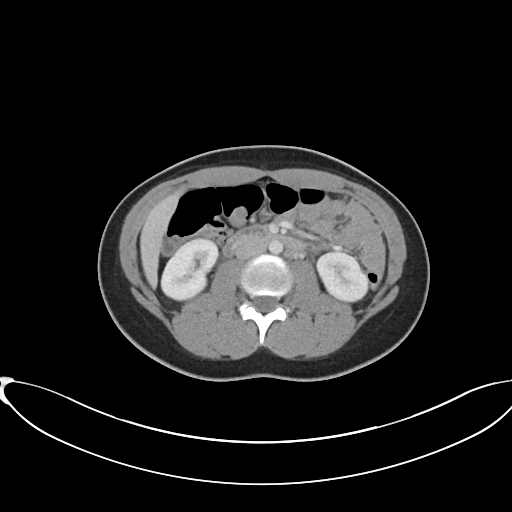
[im 67/94  soft-tissue]
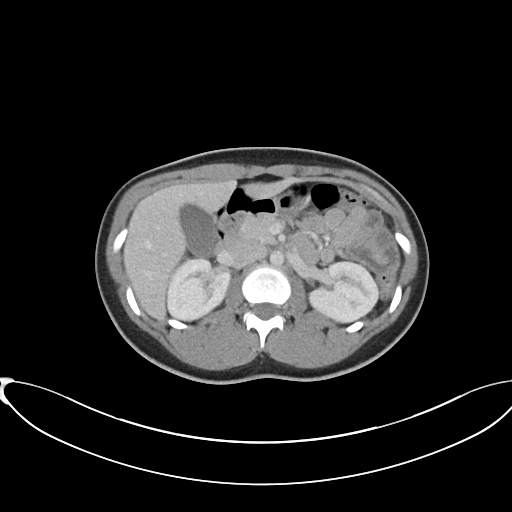
[im 67/94  bone]
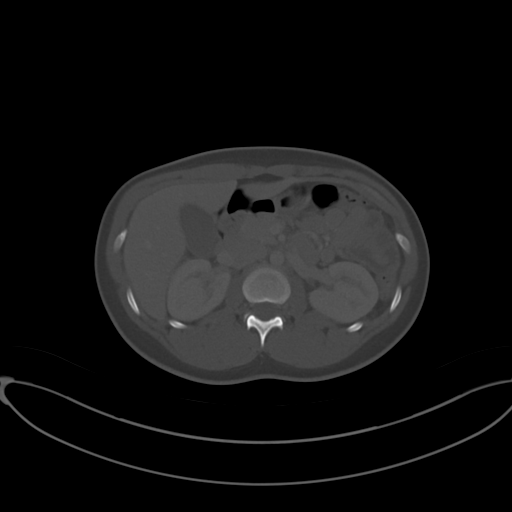
[im 75/94  soft-tissue]
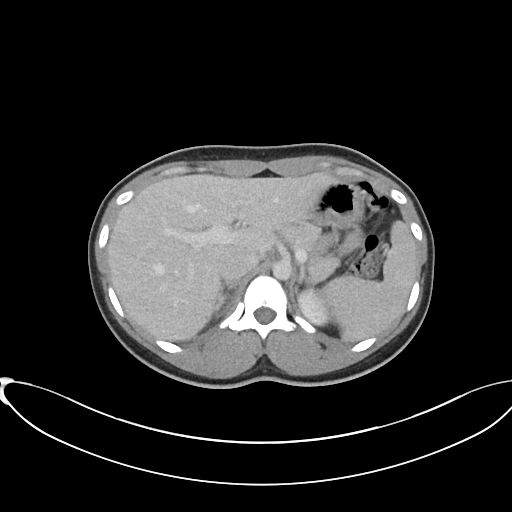
[im 82/94  soft-tissue]
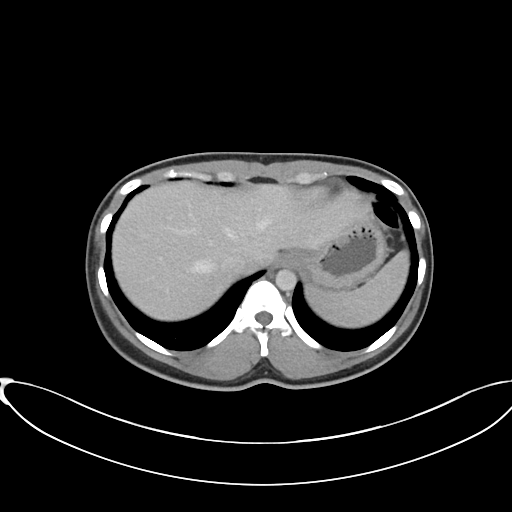
[im 90/94  soft-tissue]
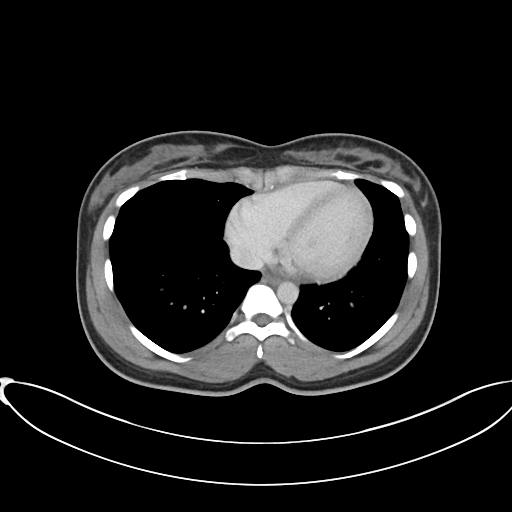

[Series 5: coronal · coronal · 0.63mm/px · 3 of 78 slices shown]
[im 26/78  soft-tissue]
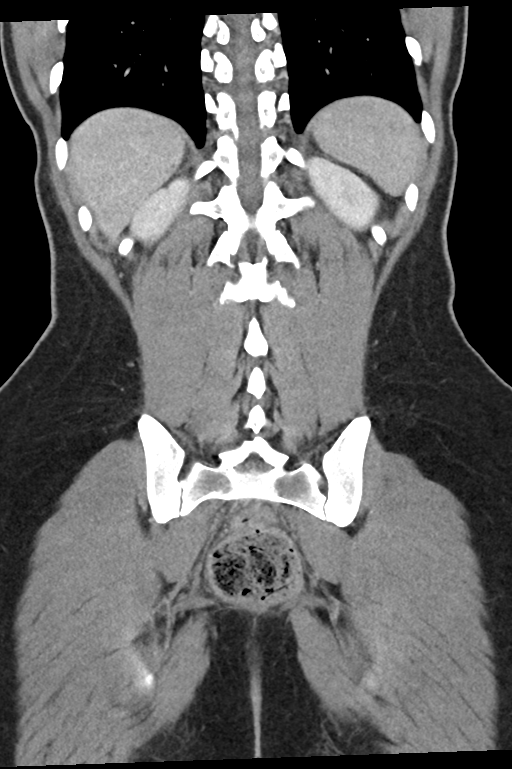
[im 35/78  soft-tissue]
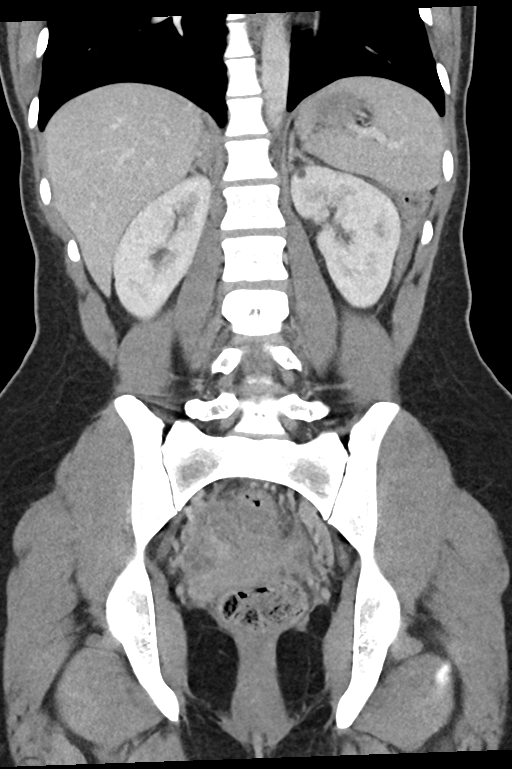
[im 43/78  soft-tissue]
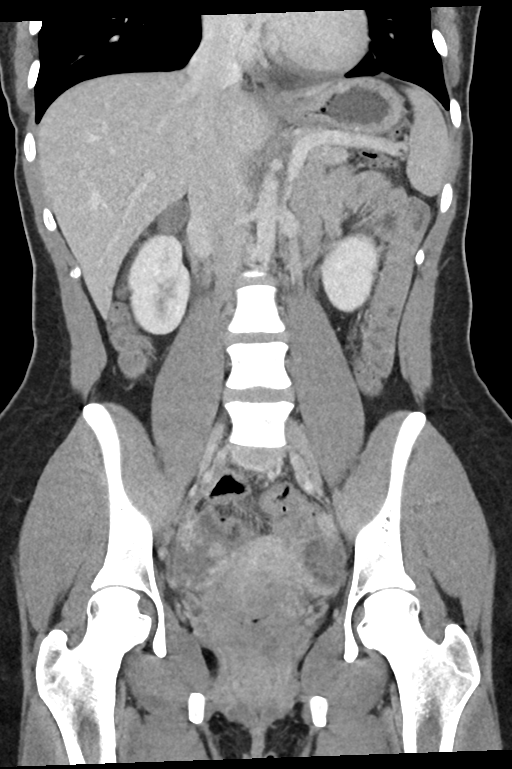

[15 of 46 positions shown; findings below may reference images not displayed]

FINDINGS: Lower chest: No acute abnormality.

Hepatobiliary: No solid liver abnormality is seen. No gallstones,
gallbladder wall thickening, or biliary dilatation.

Pancreas: Unremarkable. No pancreatic ductal dilatation or
surrounding inflammatory changes.

Spleen: Normal in size without significant abnormality.

Adrenals/Urinary Tract: Adrenal glands are unremarkable. Kidneys are
normal, without renal calculi, solid lesion, or hydronephrosis.
Bladder is unremarkable.

Stomach/Bowel: Stomach is within normal limits. Appendix is not
clearly visualized. No evidence of bowel wall thickening,
distention, or inflammatory changes.

Vascular/Lymphatic: No significant vascular findings are present. No
enlarged abdominal or pelvic lymph nodes.

Reproductive: No mass or other significant abnormality. Bilateral
functional ovarian follicles.

Other: No abdominal wall hernia or abnormality. No abdominopelvic
ascites.

Musculoskeletal: No acute or significant osseous findings.
IMPRESSION: 1. No acute CT findings of the abdomen or pelvis to explain right
lower quadrant pain.
2. Appendix is not clearly visualized, however there are no
secondary inflammatory findings in the right lower quadrant to
suggest appendicitis.

## 2023-07-28 ENCOUNTER — Other Ambulatory Visit: Payer: Self-pay | Admitting: Student

## 2023-07-28 DIAGNOSIS — R109 Unspecified abdominal pain: Secondary | ICD-10-CM

## 2023-08-01 ENCOUNTER — Ambulatory Visit
Admission: RE | Admit: 2023-08-01 | Discharge: 2023-08-01 | Disposition: A | Discharge status: Home / Self Care | Source: Ambulatory Visit | Attending: Student | Admitting: Student

## 2023-08-01 DIAGNOSIS — R109 Unspecified abdominal pain: Secondary | ICD-10-CM

## 2023-08-09 ENCOUNTER — Ambulatory Visit
Admission: RE | Admit: 2023-08-09 | Discharge: 2023-08-09 | Disposition: A | Discharge status: Home / Self Care | Payer: Self-pay | Source: Ambulatory Visit | Attending: Physician Assistant | Admitting: Physician Assistant

## 2023-08-09 VITALS — BP 132/85 | HR 87 | Temp 98.2°F | Resp 18 | Wt 214.0 lb

## 2023-08-09 DIAGNOSIS — N926 Irregular menstruation, unspecified: Secondary | ICD-10-CM | POA: Insufficient documentation

## 2023-08-09 DIAGNOSIS — N898 Other specified noninflammatory disorders of vagina: Secondary | ICD-10-CM | POA: Insufficient documentation

## 2023-08-09 LAB — POCT URINE PREGNANCY: Preg Test, Ur: NEGATIVE

## 2023-08-09 NOTE — ED Provider Notes (Signed)
 [facility]    CSN: [id number] Arrival date & time: 08/09/23  1804      History   Chief Complaint Chief Complaint  Patient presents with  . Vaginal Itching    Possibly BV - Entered by patient    HPI Helen Dalton is a 23 y.o. female.    Vaginal Itching Pertinent negatives include no abdominal pain and no shortness of breath.    Past Medical History:  Diagnosis Date  . Enlarged tonsils and adenoids   . Medical history non-contributory     There are no active problems to display for this patient.   Past Surgical History:  Procedure Laterality Date  . NO PAST SURGERIES    . TONSILLECTOMY Bilateral 07/04/2022   Procedure: TONSILLECTOMY;  Surgeon: Jesus Oliphant, MD;  Location: [facility];  Service: ENT;  Laterality: Bilateral;    OB History   No obstetric history on file.      Home Medications    Prior to Admission medications   Medication Sig Start Date End Date Taking? Authorizing Provider  clindamycin (CLEOCIN T) 1 % lotion Apply topically every morning. 04/07/21   [provider]  fluconazole  (DIFLUCAN ) 150 MG tablet Take 1 tablet (150 mg total) by mouth every three (3) days as needed (yeast). Repeat if needed 01/16/23   Arloa Suzen RAMAN, NP  HYDROcodone -acetaminophen  (HYCET) 7.5-325 mg/15 ml solution Take 15 mLs by mouth every 6 (six) hours as needed for moderate pain. Patient not taking: Reported on 01/16/2023 07/04/22   Jesus Oliphant, MD  ibuprofen  (ADVIL ) 600 MG tablet Take 1 tablet (600 mg total) by mouth every 8 (eight) hours as needed. Patient not taking: Reported on 01/16/2023 10/26/21   Lynwood Lenis, PA-C  ondansetron  (ZOFRAN -ODT) 4 MG disintegrating tablet Take 1 tablet (4 mg total) by mouth every 8 (eight) hours as needed for nausea or vomiting. Patient not taking: Reported on 01/16/2023 07/04/22   Jesus Oliphant, MD  tretinoin (RETIN-A) 0.025 % cream Apply topically at bedtime. 04/07/21   [provider]     Family History No family history on file.  Social History Social History   Tobacco Use  . Smoking status: Never  . Smokeless tobacco: Never  Vaping Use  . Vaping status: Every Day  Substance Use Topics  . Alcohol use: Yes    Comment: occ  . Drug use: Never     Allergies   Patient has no known allergies.   Review of Systems Review of Systems  Constitutional:  Negative for chills and fever.  Eyes:  Negative for discharge and redness.  Respiratory:  Negative for shortness of breath.   Gastrointestinal:  Negative for abdominal pain, nausea and vomiting.  Genitourinary:  Negative for genital sores and vaginal discharge.     Physical Exam Triage Vital Signs ED Triage Vitals [08/09/23 1832]  Encounter Vitals Group     BP      Girls Systolic BP Percentile      Girls Diastolic BP Percentile      Boys Systolic BP Percentile      Boys Diastolic BP Percentile      Pulse      Resp      Temp      Temp src      SpO2      Weight      Height      Head Circumference      Peak Flow      Pain Score 0  Pain Loc      Pain Education      Exclude from Growth Chart    No data found.  Updated Vital Signs There were no vitals taken for this visit.  Visual Acuity Right Eye Distance:   Left Eye Distance:   Bilateral Distance:    Right Eye Near:   Left Eye Near:    Bilateral Near:     Physical Exam Vitals and nursing note reviewed.  Constitutional:      General: She is not in acute distress.    Appearance: Normal appearance. She is not ill-appearing.  HENT:     Head: Normocephalic and atraumatic.  Eyes:     Conjunctiva/sclera: Conjunctivae normal.  Cardiovascular:     Rate and Rhythm: Normal rate.  Pulmonary:     Effort: Pulmonary effort is normal.  Neurological:     Mental Status: She is alert.  Psychiatric:        Mood and Affect: Mood normal.        Behavior: Behavior normal.        Thought Content: Thought content normal.      UC Treatments /  Results  Labs (all labs ordered are listed, but only abnormal results are displayed) Labs Reviewed - No data to display  EKG   Radiology No results found.  Procedures Procedures (including critical care time)  Medications Ordered in UC Medications - No data to display  Initial Impression / Assessment and Plan / UC Course  I have reviewed the triage vital signs and the nursing notes.  Pertinent labs & imaging results that were available during my care of the patient were reviewed by me and considered in my medical decision making (see chart for details).     *** Final Clinical Impressions(s) / UC Diagnoses   Final diagnoses:  None   Discharge Instructions   None    ED Prescriptions   None    PDMP not reviewed this encounter.

## 2023-08-09 NOTE — ED Triage Notes (Signed)
 Pt presents c/o slight vaginal odor and itch x 2 days. Pt denies any additional sxs.

## 2023-08-10 ENCOUNTER — Ambulatory Visit (HOSPITAL_COMMUNITY): Payer: Self-pay

## 2023-08-10 LAB — CERVICOVAGINAL ANCILLARY ONLY
Bacterial Vaginitis (gardnerella): POSITIVE — AB
Candida Glabrata: NEGATIVE
Candida Vaginitis: POSITIVE — AB
Chlamydia: POSITIVE — AB
Comment: NEGATIVE
Comment: NEGATIVE
Comment: NEGATIVE
Comment: NEGATIVE
Comment: NEGATIVE
Comment: NORMAL
Neisseria Gonorrhea: NEGATIVE
Trichomonas: NEGATIVE

## 2023-08-10 MED ORDER — METRONIDAZOLE 500 MG PO TABS: 500.0000 mg | ORAL_TABLET | Freq: Two times a day (BID) | ORAL | 0 refills | Status: DC

## 2023-08-10 MED ORDER — METRONIDAZOLE 0.75 % VA GEL: 1.0000 | Freq: Every day | VAGINAL | 0 refills | Status: AC

## 2023-08-10 MED ORDER — FLUCONAZOLE 150 MG PO TABS: 150.0000 mg | ORAL_TABLET | Freq: Once | ORAL | 0 refills | Status: AC

## 2023-08-10 MED ORDER — DOXYCYCLINE HYCLATE 100 MG PO TABS: 100.0000 mg | ORAL_TABLET | Freq: Two times a day (BID) | ORAL | 0 refills | Status: AC

## 2023-08-11 ENCOUNTER — Encounter (HOSPITAL_COMMUNITY): Payer: Self-pay

## 2023-10-09 ENCOUNTER — Ambulatory Visit
Admission: RE | Admit: 2023-10-09 | Discharge: 2023-10-09 | Disposition: A | Discharge status: Home / Self Care | Source: Ambulatory Visit | Attending: Nurse Practitioner | Admitting: Nurse Practitioner

## 2023-10-09 VITALS — BP 115/83 | HR 101 | Temp 98.4°F | Resp 18 | Wt 214.1 lb

## 2023-10-09 DIAGNOSIS — U071 COVID-19: Secondary | ICD-10-CM | POA: Diagnosis not present

## 2023-10-09 LAB — POC SOFIA SARS ANTIGEN FIA: SARS Coronavirus 2 Ag: POSITIVE — AB

## 2023-10-09 NOTE — ED Triage Notes (Signed)
 Pt presents c/o URI x 2 days. Pt reports late Saturday night the sxs onset. Sxs include cough, runny nose, itchy throat, chills, and headache. Pt denies emesis and diarrhea.

## 2023-10-09 NOTE — Discharge Instructions (Addendum)
 You tested positive for COVID-19. This is a viral infection, so antibiotics are not helpful. Please continue the medications you've been taking for symptoms management, drink plenty of fluids, and get plenty of rest. COVID-19 illness is generally milder now than in the past, especially in people who have been vaccinated or previously infected. According to updated CDC guidance, you do not need to isolate for a set number of days. You may return to normal activities once your symptoms are improving and you have been fever-free for at least 24 hours without using fever-reducing medications such as Tylenol  or ibuprofen . Monitor your symptoms closely over the next several days. If you are not feeling better within a week, contact your primary care provider. Go to the emergency department immediately if you develop shortness of breath, chest pain, dizziness, fainting, confusion, severe weakness, or if you cannot keep fluids down.

## 2023-10-09 NOTE — ED Provider Notes (Signed)
 [facility]    CSN: [id number] Arrival date & time: 10/09/23  1353      History   Chief Complaint Chief Complaint  Patient presents with   Headache    Entered by patient   Fever   Generalized Body Aches   Cough    HPI Helen Dalton is a 23 y.o. female.   Discussed the use of AI scribe software for clinical note transcription with the patient, who gave verbal consent to proceed.   The patient presents with complaints of headache, fever, chills, body aches, and sore throat that began on 2 nights ago. The symptoms started with an itchy throat on which progressed to fevers, chills, body aches, and a severe headache. The sore throat also worsened during this time. Today, the patient reports that the fever has resolved, and the headache improved after taking DayQuil. Currently, the main symptom is a stuffy and runny nose. The patient denies any nausea, vomiting, or diarrhea. There is no mention of exposure to sick individuals.  The following sections of the patient's history were reviewed and updated as appropriate: allergies, current medications, past family history, past medical history, past social history, past surgical history, and problem list.     Past Medical History:  Diagnosis Date   Enlarged tonsils and adenoids    Medical history non-contributory     There are no active problems to display for this patient.   Past Surgical History:  Procedure Laterality Date   NO PAST SURGERIES     TONSILLECTOMY Bilateral 07/04/2022   Procedure: TONSILLECTOMY;  Surgeon: Jesus Oliphant, MD;  Location: [facility];  Service: ENT;  Laterality: Bilateral;    OB History   No obstetric history on file.      Home Medications    Prior to Admission medications   Medication Sig Start Date End Date Taking? Authorizing Provider  clindamycin (CLEOCIN T) 1 % lotion Apply topically every morning. 04/07/21   [provider]  fluconazole  (DIFLUCAN )  150 MG tablet Take 1 tablet (150 mg total) by mouth every three (3) days as needed (yeast). Repeat if needed 01/16/23   Arloa Suzen RAMAN, NP  HYDROcodone -acetaminophen  (HYCET) 7.5-325 mg/15 ml solution Take 15 mLs by mouth every 6 (six) hours as needed for moderate pain. Patient not taking: Reported on 01/16/2023 07/04/22   Jesus Oliphant, MD  ibuprofen  (ADVIL ) 600 MG tablet Take 1 tablet (600 mg total) by mouth every 8 (eight) hours as needed. Patient not taking: Reported on 01/16/2023 10/26/21   Lynwood Lenis, PA-C  ondansetron  (ZOFRAN -ODT) 4 MG disintegrating tablet Take 1 tablet (4 mg total) by mouth every 8 (eight) hours as needed for nausea or vomiting. Patient not taking: Reported on 01/16/2023 07/04/22   Jesus Oliphant, MD  tretinoin (RETIN-A) 0.025 % cream Apply topically at bedtime. 04/07/21   [provider]    Family History History reviewed. No pertinent family history.  Social History Social History   Tobacco Use   Smoking status: Never    Passive exposure: Never   Smokeless tobacco: Never  Vaping Use   Vaping status: Every Day  Substance Use Topics   Alcohol use: Yes    Comment: occ   Drug use: Never     Allergies   Patient has no known allergies.   Review of Systems Review of Systems  Constitutional:  Positive for chills and fever.  HENT:  Positive for congestion, rhinorrhea and sneezing (mild). Negative for sore throat (itchy).   Respiratory:  Positive for cough (mild).   Gastrointestinal:  Negative for diarrhea, nausea and vomiting.  Musculoskeletal:  Positive for myalgias.  Neurological:  Positive for headaches.  All other systems reviewed and are negative.    Physical Exam Triage Vital Signs ED Triage Vitals  Encounter Vitals Group     BP 10/09/23 1443 115/83     Girls Systolic BP Percentile --      Girls Diastolic BP Percentile --      Boys Systolic BP Percentile --      Boys Diastolic BP Percentile --      Pulse Rate 10/09/23 1443 (!) 101      Resp 10/09/23 1443 18     Temp 10/09/23 1443 98.4 F (36.9 C)     Temp Source 10/09/23 1443 Oral     SpO2 10/09/23 1443 97 %     Weight 10/09/23 1442 214 lb 1.1 oz (97.1 kg)     Height --      Head Circumference --      Peak Flow --      Pain Score 10/09/23 1441 0     Pain Loc --      Pain Education --      Exclude from Growth Chart --    No data found.  Updated Vital Signs BP 115/83 (BP Location: Left Arm)   Pulse (!) 101   Temp 98.4 F (36.9 C) (Oral)   Resp 18   Wt 214 lb 1.1 oz (97.1 kg)   LMP 09/18/2023 (Exact Date)   SpO2 97%   BMI 34.55 kg/m   Visual Acuity Right Eye Distance:   Left Eye Distance:   Bilateral Distance:    Right Eye Near:   Left Eye Near:    Bilateral Near:     Physical Exam Vitals reviewed.  Constitutional:      General: She is awake. She is not in acute distress.    Appearance: Normal appearance. She is well-developed. She is not ill-appearing, toxic-appearing or diaphoretic.  HENT:     Head: Normocephalic.     Right Ear: Tympanic membrane, ear canal and external ear normal. No drainage, swelling or tenderness. No middle ear effusion. Tympanic membrane is not erythematous.     Left Ear: Tympanic membrane, ear canal and external ear normal. No drainage, swelling or tenderness.  No middle ear effusion. Tympanic membrane is not erythematous.     Nose: Congestion present. No rhinorrhea.     Mouth/Throat:     Lips: Pink.     Mouth: Mucous membranes are moist.     Pharynx: No pharyngeal swelling, oropharyngeal exudate, posterior oropharyngeal erythema or uvula swelling.     Tonsils: No tonsillar exudate or tonsillar abscesses.  Eyes:     General: Vision grossly intact.     Conjunctiva/sclera: Conjunctivae normal.  Cardiovascular:     Rate and Rhythm: Normal rate.     Heart sounds: Normal heart sounds.  Pulmonary:     Effort: Pulmonary effort is normal. No tachypnea or respiratory distress.     Breath sounds: Normal breath sounds and air  entry.  Musculoskeletal:        General: Normal range of motion.     Cervical back: Normal range of motion and neck supple.  Lymphadenopathy:     Cervical: No cervical adenopathy.  Skin:    General: Skin is warm and dry.  Neurological:     General: No focal deficit present.     Mental Status: She is alert  and oriented to person, place, and time.  Psychiatric:        Behavior: Behavior is cooperative.      UC Treatments / Results  Labs (all labs ordered are listed, but only abnormal results are displayed) Labs Reviewed  POC SOFIA SARS ANTIGEN FIA - Abnormal; Notable for the following components:      Result Value   SARS Coronavirus 2 Ag Positive (*)    All other components within normal limits    EKG   Radiology No results found.  Procedures Procedures (including critical care time)  Medications Ordered in UC Medications - No data to display  Initial Impression / Assessment and Plan / UC Course  I have reviewed the triage vital signs and the nursing notes.  Pertinent labs & imaging results that were available during my care of the patient were reviewed by me and considered in my medical decision making (see chart for details).     The patient presents with symptoms of an acute respiratory illness and tested positive for COVID-19. The patient should maintain hydration and get adequate rest. Education was provided regarding the current understanding of COVID-19, which has generally become less severe compared to earlier stages of the pandemic due to increased immunity and viral evolution. The patient was counseled on updated CDC guidance, which no longer requires a strict five-day isolation period. Instead, return to normal activities is appropriate once symptoms are improving overall and the patient has been fever-free for at least 24 hours without fever-reducing medications. Close monitoring of symptoms over the next several days was advised, with follow-up with primary  care if symptoms are not improving within a week. The patient should seek emergency care for shortness of breath, chest pain, dizziness, fainting, confusion, severe weakness, or inability to keep fluids down.  Today's evaluation has revealed no signs of a dangerous process. Discussed diagnosis with patient and/or guardian. Patient and/or guardian aware of their diagnosis, possible red flag symptoms to watch out for and need for close follow up. Patient and/or guardian understands verbal and written discharge instructions. Patient and/or guardian comfortable with plan and disposition.  Patient and/or guardian has a clear mental status at this time, good insight into illness (after discussion and teaching) and has clear judgment to make decisions regarding their care  Documentation was completed with the aid of voice recognition software. Transcription may contain typographical errors.  Final Clinical Impressions(s) / UC Diagnoses   Final diagnoses:  COVID-19     Discharge Instructions      You tested positive for COVID-19. This is a viral infection, so antibiotics are not helpful. Please continue the medications you've been taking for symptoms management, drink plenty of fluids, and get plenty of rest. COVID-19 illness is generally milder now than in the past, especially in people who have been vaccinated or previously infected. According to updated CDC guidance, you do not need to isolate for a set number of days. You may return to normal activities once your symptoms are improving and you have been fever-free for at least 24 hours without using fever-reducing medications such as Tylenol  or ibuprofen . Monitor your symptoms closely over the next several days. If you are not feeling better within a week, contact your primary care provider. Go to the emergency department immediately if you develop shortness of breath, chest pain, dizziness, fainting, confusion, severe weakness, or if you cannot keep  fluids down.     ED Prescriptions   None    PDMP not reviewed  this encounter.   Iola Lukes, [place] 10/09/23 1529

## 2023-10-19 ENCOUNTER — Other Ambulatory Visit: Payer: Self-pay

## 2023-10-19 ENCOUNTER — Ambulatory Visit
Admission: RE | Admit: 2023-10-19 | Discharge: 2023-10-19 | Disposition: A | Discharge status: Home / Self Care | Payer: Self-pay | Attending: Family Medicine | Admitting: Family Medicine

## 2023-10-19 VITALS — BP 114/79 | HR 88 | Temp 98.1°F | Resp 16

## 2023-10-19 DIAGNOSIS — B9689 Other specified bacterial agents as the cause of diseases classified elsewhere: Secondary | ICD-10-CM | POA: Diagnosis not present

## 2023-10-19 DIAGNOSIS — L089 Local infection of the skin and subcutaneous tissue, unspecified: Secondary | ICD-10-CM

## 2023-10-19 MED ORDER — DOXYCYCLINE HYCLATE 100 MG PO CAPS: 100.0000 mg | ORAL_CAPSULE | Freq: Two times a day (BID) | ORAL | 0 refills | Status: AC

## 2023-10-19 MED ORDER — FLUCONAZOLE 150 MG PO TABS: ORAL_TABLET | ORAL | 0 refills | Status: AC

## 2023-10-19 NOTE — ED Provider Notes (Signed)
 [facility]   [id number] 10/19/23 Arrival Time: 1847  ASSESSMENT & PLAN:  1. Localized bacterial skin infection   Still draining some pus but no indication for proper I&D at this time. Begin: Meds ordered this encounter  Medications   doxycycline  (VIBRAMYCIN ) 100 MG capsule    Sig: Take 1 capsule (100 mg total) by mouth 2 (two) times daily.    Dispense:  14 capsule    Refill:  0   fluconazole  (DIFLUCAN ) 150 MG tablet    Sig: Take one tablet by mouth as a single dose. May repeat in 3 days if symptoms persist.    Dispense:  2 tablet    Refill:  0    Follow-up Information     [facility]).   Specialty: Urgent Care Why: If worsening or failing to improve as anticipated. Contact information: [address] [place]  [address] [phone]                 Reviewed expectations re: course of current medical issues. Questions answered. Outlined signs and symptoms indicating need for more acute intervention. Patient verbalized understanding. After Visit Summary given.   SUBJECTIVE:  Helen Dalton is a 23 y.o. female who presents with a possible infection of her tailbone. Pt reports she had a cyst on her tailbone that she drained on Sunday. States she is still having pain and the area is still draining some. No meds tried. She has been using a gauze with witchhazel to the area   OBJECTIVE:  Vitals:   10/19/23 1907  BP: 114/79  Pulse: 88  Resp: 16  Temp: 98.1 F (36.7 C)  TempSrc: Oral  SpO2: 97%     General appearance: alert; no distress Skin: superior gluteal cleft with flat open wound; scant purulent drainage; no pain; no bleeding Psychological: alert and cooperative; normal mood and affect  No Known Allergies  Past Medical History:  Diagnosis Date   Enlarged tonsils and adenoids    Medical history non-contributory    Social History   Socioeconomic History    Marital status: Single    Spouse name: Not on file   Number of children: Not on file   Years of education: Not on file   Highest education level: Not on file  Occupational History   Not on file  Tobacco Use   Smoking status: Never    Passive exposure: Never   Smokeless tobacco: Never  Vaping Use   Vaping status: Former  Substance and Sexual Activity   Alcohol use: Yes    Comment: occ   Drug use: Never   Sexual activity: Yes    Birth control/protection: None  Other Topics Concern   Not on file  Social History Narrative   Not on file   Social Drivers of Health   Financial Resource Strain: Not on file  Food Insecurity: Low Risk  (08/04/2022)   Received from [facility]   Hunger Vital Sign    Within the past 12 months, you worried that your food would run out before you got money to buy more: Never true    Within the past 12 months, the food you bought just didn't last and you didn't have money to get more. : Never true  Transportation Needs: Not on file (08/04/2022)  Physical Activity: Not on file  Stress: Not on file  Social Connections: Not on file   History reviewed. No pertinent family history. Past Surgical History:  Procedure Laterality Date   NO PAST SURGERIES     TONSILLECTOMY Bilateral 07/04/2022   Procedure: TONSILLECTOMY;  Surgeon: Jesus Oliphant, MD;  Location: [facility];  Service: ENT;  Laterality: Coral Rolinda Rogue, MD 10/19/23 1943

## 2023-10-19 NOTE — ED Triage Notes (Signed)
 Pt reports she had a cyst on her tailbone that she drained on Sunday. States she is still having pain and the area is still draining some. No meds tried. She has been using a gauze with witchhazel to the area
